# Patient Record
Sex: Female | Born: 1995 | Race: Black or African American | Hispanic: No | Marital: Single | Smoking: Never smoker
Health system: Southern US, Community
[De-identification: ages and names within clinical notes are randomized; demographics above are authoritative.]

## PROBLEM LIST (undated history)

## (undated) ENCOUNTER — Inpatient Hospital Stay (HOSPITAL_COMMUNITY): Payer: 59

## (undated) DIAGNOSIS — Z803 Family history of malignant neoplasm of breast: Secondary | ICD-10-CM

## (undated) DIAGNOSIS — T7840XA Allergy, unspecified, initial encounter: Secondary | ICD-10-CM

## (undated) DIAGNOSIS — Z8481 Family history of carrier of genetic disease: Secondary | ICD-10-CM

## (undated) DIAGNOSIS — J45909 Unspecified asthma, uncomplicated: Secondary | ICD-10-CM

## (undated) DIAGNOSIS — Z8041 Family history of malignant neoplasm of ovary: Secondary | ICD-10-CM

## (undated) HISTORY — DX: Family history of malignant neoplasm of breast: Z80.3

## (undated) HISTORY — DX: Unspecified asthma, uncomplicated: J45.909

## (undated) HISTORY — DX: Allergy, unspecified, initial encounter: T78.40XA

## (undated) HISTORY — PX: NO PAST SURGERIES: SHX2092

## (undated) HISTORY — DX: Family history of carrier of genetic disease: Z84.81

## (undated) HISTORY — DX: Family history of malignant neoplasm of ovary: Z80.41

---
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
## 2017-01-01 NOTE — Telephone Encounter (Signed)
Scheduled appt.

## 2017-01-03 ENCOUNTER — Telehealth: Payer: Self-pay | Admitting: Medical Oncology

## 2017-01-03 NOTE — Telephone Encounter (Signed)
I returned call from on call service. Pt needs to r/s her "appointment". I called back and left message that pt has appt for genetic clinic on 02/08/16

## 2017-01-04 ENCOUNTER — Telehealth: Payer: Self-pay | Admitting: Genetics

## 2017-01-04 NOTE — Telephone Encounter (Signed)
OK. Thank you

## 2017-01-04 NOTE — Telephone Encounter (Signed)
01/04/17 patient called back to reschedule Genetic counseling appointment to 02/06/17 @ 10:00 am with Darral DashLindsay Smith.

## 2017-01-26 ENCOUNTER — Ambulatory Visit: Payer: Self-pay | Admitting: Internal Medicine

## 2017-02-06 ENCOUNTER — Other Ambulatory Visit: Payer: 59

## 2017-02-06 ENCOUNTER — Encounter: Payer: Self-pay | Admitting: Genetics

## 2017-02-06 ENCOUNTER — Inpatient Hospital Stay: Payer: 59 | Attending: Genetic Counselor | Admitting: Genetics

## 2017-02-06 DIAGNOSIS — Z803 Family history of malignant neoplasm of breast: Secondary | ICD-10-CM

## 2017-02-06 DIAGNOSIS — Z8041 Family history of malignant neoplasm of ovary: Secondary | ICD-10-CM | POA: Diagnosis not present

## 2017-02-06 DIAGNOSIS — Z8481 Family history of carrier of genetic disease: Secondary | ICD-10-CM | POA: Diagnosis not present

## 2017-02-06 NOTE — Progress Notes (Signed)
REFERRING PROVIDER: Aloha Gell, MD [place], [place] [address]  PRIMARY PROVIDER:  Alba Cory, MD  PRIMARY REASON FOR VISIT:  1. Family history of BRCA1 gene positive   2. Family history of breast cancer   3. Family history of ovarian cancer      HISTORY OF PRESENT ILLNESS:   [name], a 22 y.o. female, was seen for a [place] cancer genetics consultation at the request of Dr. Pamala Hurry due to a family history of a BRCA1 mutation identified in her mother.   [name] presents to clinic today to discuss the possibility of a hereditary predisposition to cancer, genetic testing, and to further clarify her future cancer risks, as well as potential cancer risks for family members.   [name] is a 22 y.o. female with no personal history of cancer.    HORMONAL RISK FACTORS:  Menarche was at age 48.  First live birth at age N/A.  OCP use for approximately depo years.  Ovaries intact: yes.  Hysterectomy: no.  Menopausal status: premenopausal.  HRT use: 0 years. Colonoscopy: no; not examined. Mammogram within the last year: no. Number of breast biopsies: 0.  Past Medical History:  Diagnosis Date  . Allergy   . Asthma   . Family history of BRCA1 gene positive   . Family history of breast cancer   . Family history of ovarian cancer     No past surgical history on file.  Social History   Socioeconomic History  . Marital status: Single    Spouse name: None  . Number of children: None  . Years of education: None  . Highest education level: None  Social Needs  . Financial resource strain: None  . Food insecurity - worry: None  . Food insecurity - inability: None  . Transportation needs - medical: None  . Transportation needs - non-medical: None  Occupational History  . None  Tobacco Use  . Smoking status: Never Smoker  . Smokeless tobacco: Never Used  Substance and Sexual Activity  . Alcohol use: Yes  . Drug use: No  . Sexual activity: None   Other Topics Concern  . None  Social History Narrative  . None     FAMILY HISTORY:  We obtained a detailed, 4-generation family history.  Significant diagnoses are listed below: Family History  Problem Relation Age of Onset  . Breast cancer Mother 35       recurred in 44's  . Hypertension Maternal Grandmother   . Diabetes Maternal Grandmother   . Other Maternal Grandmother        hysterectomy in 40's  . Ovarian cancer Other   . Breast cancer Other    [name] no children, and has a 44 year-old sister with no history of cancer.  This sister has recently had a baby daughter.    [name] mother is not in her 32's, had breast cancer at 57 and again in her 68's.  She had genetic testing in Nov. 2012 for the BRCA1, BRCA2 and TP53 genes.  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 [name] has 5 maternal uncles with no history of cancer.  She has several maternal cousins, none with any history of cancer and none of her cousins has had genetic testing to her knowledge.  [name] maternal grandfather died in his 32's with no history of cancer.  He had 5 sisters- 1 who died in her 37's due to breast  cancer, 2 who had unknown types of cancer, and 2 with no history of cancer.  [name] maternal grandmother is in her 57's with no history of cancer.  She had a hysterectomy in her 8's.  This grandmother has 4 sisters- 19 of which died of cancer at 59 (unknown type).  [name] maternal grandmother's mother (great grand mother) died of ovarian cancer in her 29's.    [name] father is is about 45 and has several health issues (with his liver?), but no cancer.  [name] does not think her father had any siblings.  She knows her paternal grandparents are deceased at 16older ages20 and is not aware of any history of cancer. However, she does not know much about these relatives.    Patient's maternal ancestors are of African American descent, and paternal  ancestors are of African American descent. There is no reported Ashkenazi Jewish ancestry. There is no known consanguinity.  GENETIC COUNSELING ASSESSMENT: Cindy Bates is a 22 y.o. female with a family history of a BRCA1 mutation in her mother. We, therefore, discussed and recommended the following at today's visit.   DISCUSSION: We reviewed the characteristics, features and inheritance patterns of hereditary cancer syndromes. We also discussed genetic testing, including the appropriate family members to test, the process of testing, insurance coverage and turn-around-time for results. We discussed the implications of a negative, positive and/or variant of uncertain significant result. We recommended Ms. Linney pursue genetic testing for the Common Hereditary Cancer gene panel. The Common Hereditary Cancer Panel offered by [facility] includes sequencing and/or deletion duplication testing of the following 47 genes: APC, ATM, AXIN2, BARD1, BMPR1A, BRCA1, BRCA2, BRIP1, CDH1, CDKN2A (p14ARF), CDKN2A (p16INK4a), CKD4, CHEK2, CTNNA1, DICER1, EPCAM (Deletion/duplication testing only), GREM1 (promoter region deletion/duplication testing only), KIT, MEN1, MLH1, MSH2, MSH3, MSH6, MUTYH, NBN, NF1, NHTL1, PALB2, PDGFRA, PMS2, POLD1, POLE, PTEN, RAD50, RAD51C, RAD51D, SDHB, SDHC, SDHD, SMAD4, SMARCA4. STK11, TP53, TSC1, TSC2, and VHL.  The following genes were evaluated for sequence changes only: SDHA and HOXB13 c.251G>A variant only.  We discussed that her mother's BRCA1 mutation means her mother has Hereditary Breast and Ovarian Cancer (HBOC) syndrome.  This syndrome is caused by mutations in the BRCA1 and BRCA2 genes.  This syndrome increases an individual's lifetime risk to develop breast, ovarian, pancreatic, and other types of cancer.  There are also many other cancer predisposition syndromes caused by mutations in several other genes.  We discussed that Ms. Traister is at a 50% chance to have inherited this BRCA1 mutation  from her mother.    We discussed high risk breast screening, the option of prophylactic bilateral mastectomy, prophylactic BSO, and briefly discussed lifestyle modifications and chemoprevention.     We discussed that if she is found to have a mutation in BRCA1 (or any other genes on the panel), it may alter future medical management recommendations such as increased cancer screenings and consideration of risk reducing surgeries.  A positive result could also have implications for the patient's family members.  A Negative result would mean she did not inherit her mother's BRCA1 mutation and we did not identify any other mutation in other cancer predisposition genes.  This will provide a definitive answer regarding the familial BRCA1 mutation.  However, given we do not know everything about cancer genetics at this time, it would not completely rule out the possibility of a hereditary predisposition to cancer.  There could be mutations that are undetectable by current technology, or in genes not  yet tested or identified to increase cancer risk.    The lab will be able to provide a definitive answer regarding the known familial BRCA1 mutation.  However, we discussed the potential to find a Variant of Uncertain Significance or VUS in the other genes analyzed on the panel.  These are variants that have not yet been identified as pathogenic or benign, and it is unknown if this variant is associated with increased cancer risk or if this is a normal finding.  Most VUS's are reclassified to benign or likely benign. It should not be used to make medical management decisions. With time, we suspect the lab will determine the significance of any VUS's identified if any.   Based on Ms. Sarracino family history of cancer, she meets medical criteria for genetic testing. Despite that she meets criteria, she may still have an out of pocket cost. We discussed that if her out of pocket cost for testing is over $100, the laboratory  will call and confirm whether she wants to proceed with testing.  If the out of pocket cost of testing is less than $100 she will be billed by the genetic testing laboratory.   We discussed that some people do not want to undergo genetic testing due to fear of genetic discrimination.  A federal law called the Genetic Information Non-Discrimination Act (GINA) of 2008 helps protect individuals against genetic discrimination based on their genetic test results.  It impacts both health insurance and employment.  For health insurance, it protects against increased premiums, being kicked off insurance or being forced to take a test in order to be insured.  For employment it protects against hiring, firing and promoting decisions based on genetic test results.  Health status due to a cancer diagnosis is not protected under GINA.  This law does not protect life insurance, disability insurance, or other types of insurance.   Ms. Valdes says that thinking about possibly having a BRCA1 mutation is difficult for her, and she is still trying to take all this information in.  However, she has known that she would do genetic testing for a while and has discussed this with her family.  She does want to proceed with testing and learn her mutation status now and feels ready to proceed with testing.    PLAN: After considering the risks, benefits, and limitations, Ms. Mottley  provided informed consent to pursue genetic testing and the blood sample was sent to [facility] for analysis of the Common Hereditary Cancer Panel. Results should be available within approximately 2-3 weeks' time.  Ms. Snell has requested that she receive these results in person.  We scheduled a follow-up appointment for February 22 at 10:00 AM for her to come in person to receive the results of her genetic testing.   At that point the results will be disclosed as will any additional recommendations warranted by these results. Ms. Sarabia will receive a  summary of her genetic counseling visit and a copy of her results once available. This information will also be available in Epic. We encouraged Ms. Tomlinson to remain in contact with cancer genetics annually so that we can continuously update the family history and inform her of any changes in cancer genetics and testing that may be of benefit for her family. Ms. Sisler questions were answered to her satisfaction today. Our contact information was provided should additional questions or concerns arise.  Based on Ms. Hellickson family history, we recommended her sister and maternal relatives, have  genetic counseling and testing. Ms. Pikus will let us know if we can be of any assistance in coordinating genetic counseling and/or testing for this family member.   Ms. Currin was provided with information from the support groups FORCE, [facility] Peer Navigation Program, and [facility], and knows these are available to her.  Some people find these resources helpful.     Lastly, we encouraged Ms. Fern to remain in contact with cancer genetics annually so that we can continuously update the family history and inform her of any changes in cancer genetics and testing that may be of benefit for this family.   Ms.  Petersen questions were answered to her satisfaction today. Our contact information was provided should additional questions or concerns arise. Thank you for the referral and allowing [place] to share in the care of your patient.   Tana Felts, MS Genetic Counselor [email or web address] phone: [phone]  The patient was seen for a total of 60 minutes in face-to-face genetic counseling.  This patient was discussed with Drs. Magrinat, Lindi Adie and/or Burr Medico who agrees with the above.

## 2017-02-07 ENCOUNTER — Encounter: Payer: Self-pay | Admitting: Genetic Counselor

## 2017-02-07 ENCOUNTER — Other Ambulatory Visit: Payer: Self-pay

## 2017-02-08 NOTE — Progress Notes (Signed)
Subjective:    Patient ID: Cindy Bates, female    DOB: October 19, 1995, 22 y.o.   MRN: [id number]  HPI  She is here to establish with a new pcp.  She is here for a physical exam.   She thinks she has a ring worm on her scalp.  She saw a derm a ago while for this and was daignosed with ring worm.  She was prescribed an oral medication and it did help.  She thought it went away, but it did come back.  She denies any side effects with the medication.  She stood up and started walking 1 week ago and had a sharp pain in her right hip.  She had difficulty walking.  She used a heating pad and it helped a little she has taken aleve.  Now it only hurts when she walks.  She was concerned since being on the Depo-Provera that she may have developed something like osteoporosis.   Medications and allergies reviewed with patient and updated if appropriate.  Patient Active Problem List   Diagnosis Date Noted  . 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 Asthma 02/09/2017  . Hip pain, acute, right 02/09/2017  . Family history of BRCA1 gene positive   . Family history of breast cancer   . Family history of ovarian cancer     Current Outpatient Medications on File Prior to Visit  Medication Sig Dispense Refill  . medroxyPROGESTERone (DEPO-PROVERA) 150 MG/ML injection Inject 150 mg into the muscle every 3 (three) months.    Marland Kitchen acyclovir (ZOVIRAX) 400 MG tablet Take 400 mg by mouth 4 (four) times daily as needed (outbreaks).      No current facility-administered medications on file prior to visit.     Past Medical History:  Diagnosis Date  . Allergy   . Asthma   . Family history of BRCA1 gene positive   . Family history of breast cancer   . Family history of ovarian cancer     History reviewed. No pertinent surgical history.  Social History   Socioeconomic History  . Marital status: Single    Spouse name: None  . Number of children: None  . Years of education: None  . Highest education level: None    Social Needs  . Financial resource strain: None  . Food insecurity - worry: None  . Food insecurity - inability: None  . Transportation needs - medical: None  . Transportation needs - non-medical: None  Occupational History  . None  Tobacco Use  . Smoking status: Never Smoker  . Smokeless tobacco: Never Used  Substance and Sexual Activity  . Alcohol use: No    Frequency: Never  . Drug use: No  . Sexual activity: None  Other Topics Concern  . None  Social History Narrative  . None    Family History  Problem Relation Age of Onset  . Breast cancer Mother 47       recurred in 3's  . Hypertension Mother   . Alcohol abuse Father   . Hypertension Father   . Hypertension Maternal Grandmother   . Diabetes Maternal Grandmother   . Other Maternal Grandmother        hysterectomy in 40's  . Alcohol abuse Paternal Grandfather   . Ovarian cancer Other   . Breast cancer Other     Review of Systems  Constitutional: Negative for appetite change, chills, fatigue, fever and unexpected weight change.  Eyes: Negative for visual disturbance.  Respiratory: Positive  for shortness of breath (asthma). Negative for cough and wheezing.   Cardiovascular: Negative for chest pain, palpitations and leg swelling.  Gastrointestinal: Negative for abdominal pain, blood in stool, constipation, diarrhea and nausea.  Genitourinary: Negative for dysuria and hematuria.  Musculoskeletal: Negative for arthralgias.  Skin: Positive for rash (scalp - ringworm).  Neurological: Negative for light-headedness and headaches.  Psychiatric/Behavioral: Negative for dysphoric mood. The patient is nervous/anxious (manageable).        Objective:   Vitals:   02/09/17 0934  BP: 108/78  Pulse: 88  Resp: 16  Temp: 98.2 F (36.8 C)  SpO2: 99%   Filed Weights   02/09/17 0934  Weight: 127 lb (57.6 kg)   Body mass index is 22.5 kg/m.  Wt Readings from Last 3 Encounters:  02/09/17 127 lb (57.6 kg) (48 %, Z=  -0.05)*  11/11/15 112 lb (50.8 kg) (23 %, Z= -0.75)*  09/27/15 110 lb (49.9 kg) (19 %, Z= -0.87)*   * Growth percentiles are based on CDC (Girls, 2-20 Years) data.     Physical Exam Constitutional: She appears well-developed and well-nourished. No distress.  HENT:  Head: Normocephalic and atraumatic.  Right Ear: External ear normal. Normal ear canal and TM Left Ear: External ear normal.  Normal ear canal and TM Mouth/Throat: Oropharynx is clear and moist.  Eyes: Conjunctivae and EOM are normal.  Neck: Neck supple. No tracheal deviation present.  Right-sided thyromegaly present.  No carotid bruit  Cardiovascular: Normal rate, regular rhythm and normal heart sounds.   No murmur heard.  No edema. Pulmonary/Chest: Effort normal and breath sounds normal. No respiratory distress. She has no wheezes. She has no rales.  Breast: deferred to Gyn Abdominal: Soft. She exhibits no distension. There is no tenderness.  Lymphadenopathy: She has no cervical adenopathy.  Skin: Skin is warm and dry. She is not diaphoretic. Erythematous ring on top of head-likely ringworm since she did have a scraping of it previously Psychiatric: She has a normal mood and affect. Her behavior is normal.        Assessment & Plan:   Physical exam: Screening blood work  deferred-she does not like needles and would prefer to avoid blood work Immunizations   Up to date  Gyn  Up to date Exercise  Walking through campus Weight very active walking around campus, no regimented exercise Skin   scalp rash, no other lesions Substance abuse  none    See Problem List for Assessment and Plan of chronic medical problems.  Follow-up as needed

## 2017-02-09 ENCOUNTER — Ambulatory Visit (INDEPENDENT_AMBULATORY_CARE_PROVIDER_SITE_OTHER): Payer: 59 | Admitting: Internal Medicine

## 2017-02-09 ENCOUNTER — Encounter: Payer: Self-pay | Admitting: Internal Medicine

## 2017-02-09 VITALS — BP 108/78 | HR 88 | Temp 98.2°F | Resp 16 | Ht 63.0 in | Wt 127.0 lb

## 2017-02-09 DIAGNOSIS — Z0001 Encounter for general adult medical examination with abnormal findings: Secondary | ICD-10-CM

## 2017-02-09 DIAGNOSIS — B009 Herpesviral infection, unspecified: Secondary | ICD-10-CM

## 2017-02-09 DIAGNOSIS — M25551 Pain in right hip: Secondary | ICD-10-CM | POA: Diagnosis not present

## 2017-02-09 DIAGNOSIS — E010 Iodine-deficiency related diffuse (endemic) goiter: Secondary | ICD-10-CM | POA: Diagnosis not present

## 2017-02-09 DIAGNOSIS — J45909 Unspecified asthma, uncomplicated: Secondary | ICD-10-CM | POA: Insufficient documentation

## 2017-02-09 DIAGNOSIS — J4520 Mild intermittent asthma, uncomplicated: Secondary | ICD-10-CM | POA: Diagnosis not present

## 2017-02-09 DIAGNOSIS — B350 Tinea barbae and tinea capitis: Secondary | ICD-10-CM | POA: Diagnosis not present

## 2017-02-09 MED ORDER — TINIDAZOLE 500 MG PO TABS: ORAL_TABLET | ORAL | 1 refills | Status: DC

## 2017-02-09 NOTE — Assessment & Plan Note (Signed)
Right-sided thyromegaly on exam We will check ultrasound

## 2017-02-09 NOTE — Assessment & Plan Note (Signed)
Oral herpes Uses acyclovir as needed

## 2017-02-09 NOTE — Assessment & Plan Note (Signed)
Started 1 week ago It is improving Advised her that it is not osteoporosis and most likely will completely resolve Continue symptomatic treatment If pain continues she will let me know and I will refer her to

## 2017-02-09 NOTE — Assessment & Plan Note (Addendum)
Mild, intermittent Rarely uses inhaler

## 2017-02-09 NOTE — Assessment & Plan Note (Signed)
Rash on top of head Was diagnosed by dermatology after scraping with ringworm Treated with an antifungal-she did not have any side effects and it did help, but it recurred Will use the same treatment, but a slightly longer course-up to 2 months She will see dermatology if it recurs again

## 2017-02-09 NOTE — Patient Instructions (Signed)
  All other Health Maintenance issues reviewed.   All recommended immunizations and age-appropriate screenings are up-to-date or discussed.  No immunizations administered today.   Medications reviewed and updated.  No changes recommended at this time.   A thyroid ultrasound was ordered - someone will call you to schedule this.

## 2017-02-23 ENCOUNTER — Ambulatory Visit
Admission: RE | Admit: 2017-02-23 | Discharge: 2017-02-23 | Disposition: A | Discharge status: Home / Self Care | Payer: 59 | Source: Ambulatory Visit | Attending: Internal Medicine | Admitting: Internal Medicine

## 2017-02-23 DIAGNOSIS — E010 Iodine-deficiency related diffuse (endemic) goiter: Secondary | ICD-10-CM

## 2017-03-08 ENCOUNTER — Encounter: Payer: Self-pay | Admitting: Genetics

## 2017-03-09 ENCOUNTER — Inpatient Hospital Stay: Payer: 59 | Attending: Genetic Counselor | Admitting: Genetics

## 2017-03-09 DIAGNOSIS — Z8481 Family history of carrier of genetic disease: Secondary | ICD-10-CM

## 2017-03-09 DIAGNOSIS — Z1379 Encounter for other screening for genetic and chromosomal anomalies: Secondary | ICD-10-CM | POA: Diagnosis not present

## 2017-03-09 DIAGNOSIS — Z8041 Family history of malignant neoplasm of ovary: Secondary | ICD-10-CM | POA: Diagnosis not present

## 2017-03-09 DIAGNOSIS — Z803 Family history of malignant neoplasm of breast: Secondary | ICD-10-CM | POA: Diagnosis not present

## 2017-03-09 NOTE — Progress Notes (Signed)
HPI: Cindy Bates was previously seen in the [facility] on 02/07/2107 due to a family history of breast/ovariant cancer and a known BRCA1 mutation identified in her mother.  Please refer to our prior cancer genetics clinic note for more information regarding Cindy Bates medical, social and family histories, and our assessment and recommendations, at the time. Cindy Bates recent genetic test results were disclosed to her, as well as recommendations warranted by these results. These results and recommendations are discussed in more detail below.  CANCER HISTORY:   No history exists.     FAMILY HISTORY:  We obtained a detailed, 4-generation family history.  Significant diagnoses are listed below: Family History  Problem Relation Age of Onset  . Breast cancer Mother 86       recurred in 9's  . Hypertension Mother   . Alcohol abuse Father   . Hypertension Father   . Hypertension Maternal Grandmother   . Diabetes Maternal Grandmother   . Other Maternal Grandmother        hysterectomy in 40's  . Alcohol abuse Paternal Grandfather   . Ovarian cancer Other   . Breast cancer Other    Cindy Bates no children, and has a 57 year-old sister with no history of cancer.  This sister has recently had a baby daughter.    Cindy Bates mother is not in her 39's, had breast cancer at 84 and again in her 74's.  She had genetic testing in Nov. 2012 for the BRCA1, BRCA2 and TP53 genes.  This genetic testing was positive for a pathogenic variant in BRCA1 [id number] ([id number]>A). Cindy Bates has 5 maternal uncles with no history of cancer.  She has several maternal cousins, none with any history of cancer and none of her cousins has had genetic testing to her knowledge.  Cindy Bates maternal grandfather died in his 48's with no history of cancer.  He had 5 sisters- 1 who died in her 69's due to breast cancer, 2 who had unknown types of cancer, and 2 with no history of cancer.  Cindy Bates maternal grandmother is in  her 33's with no history of cancer.  She had a hysterectomy in her 76's.  This grandmother has 4 sisters- 26 of which died of cancer at 48 (unknown type).  Cindy Bates maternal grandmother's mother (great grand mother) died of ovarian cancer in her 67's.    Cindy Bates father is is about 53 and has several health issues (with his liver?), but no cancer.  Cindy Bates does not think her father had any siblings.  She knows her paternal grandparents are deceased at 60older ages69 and is not aware of any history of cancer. However, she does not know much about these relatives.    Patient's maternal ancestors are of African American descent, and paternal ancestors are of African American descent. There is no reported Ashkenazi Jewish ancestry. There is no known consanguinity.  GENETIC TEST RESULTS: Genetic testing performed through [facility]'s Common Hereditary Cancer Panel reported out on 02/21/2017 showed no pathogenic mutations. The Common Hereditary Cancer Panel offered by [facility] includes sequencing and/or deletion duplication testing of the following 47 genes: APC, ATM, AXIN2, BARD1, BMPR1A, BRCA1, BRCA2, BRIP1, CDH1, CDKN2A (p14ARF), CDKN2A (p16INK4a), CKD4, CHEK2, CTNNA1, DICER1, EPCAM (Deletion/duplication testing only), GREM1 (promoter region deletion/duplication testing only), KIT, MEN1, MLH1, MSH2, MSH3, MSH6, MUTYH, NBN, NF1, NHTL1, PALB2, PDGFRA, PMS2, POLD1, POLE, PTEN, RAD50, RAD51C, RAD51D, SDHB, SDHC, SDHD, SMAD4, SMARCA4. STK11, TP53, TSC1, TSC2, and VHL.  The following genes were evaluated for sequence changes only: SDHA and HOXB13 c.251G>A variant only..  The test report will be scanned into EPIC and will be located under the Molecular Pathology section of the Results Review tab.A portion of the result report is included below for reference.     We discussed with Cindy Bates that because current genetic testing is not perfect, it is possible there may be a gene mutation in one of these genes that  current testing cannot detect, but that chance is small. We also discussed, that there could be another gene that has not yet been discovered, or that we have not yet tested, that is responsible for the cancer diagnoses in the family. It is also possible there is a hereditary cause for the cancer in the family that Cindy Bates did not inherit and therefore was not identified in her testing.  Therefore, it is important to remain in touch with cancer genetics in the future so that we can continue to offer Cindy Bates the most up to date genetic testing.   Cindy Bates test was normal and did not reveal the familial BRCA1 mutation. We call this result a true negative result because the cancer-causing mutation was identified in Cindy Bates family, and she did not inherit it.  Given this negative result, Cindy Bates chances of developing BRCA-related cancers are likely the same as they are in the general population.  We discussed that Cindy Bates should continue to follow the cancer screening recommendations provided to her by her medical providers as other factors may still impact her risk, and there are general population screening guidelines that are still important to follow.   ADDITIONAL GENETIC TESTING: We discussed with Cindy Bates that there are other genes that are associated with increased cancer risk that can be analyzed. The laboratories that offer this testing look at these additional genes via a hereditary cancer gene panel. Should Ms. Shimizu wish to pursue additional genetic testing, we are happy to discuss and coordinate this testing, at any time.    CANCER SCREENING RECOMMENDATIONS:  Genetic testing has provided a diagnostic/definitive answer regarding the absence of the familial BRCA1 pathogenic variant in Ms. Mullings.   Genetic testing for the other genes on this panel was also negative.  This indicates no other pathogenic variants were identified in any of the other cancer risk genes analyzed.  However, genetic  testing is not perfect and cannot definitively rule out a hereditary predisposition to cancer as there could be mutations in genes not yet identified to increase cancer risk/tested or our technology does not yet detect all genetic changes.   RECOMMENDATIONS FOR FAMILY MEMBERS: Women in this family might be at some increased risk of developing cancer, over the general population risk, simply due to the family history of cancer. We recommended women in this family have a yearly mammogram beginning at age 6, or 20 years younger than the earliest onset of cancer, an annual clinical breast exam, and perform monthly breast self-exams. Women in this family should also have a gynecological exam as recommended by their primary provider. All family members should have a colonoscopy by age 32.  All family members should inform their physicians about the family history of cancer so their doctors can make the most appropriate screening recommendations for them.   Based on Ms. Rask family history of a BRCA1, we strongly recommended her sister and all other maternal relatives, have genetic counseling and testing. Ms. Gullickson will let us know  if we can be of any assistance in coordinating genetic counseling and/or testing for these family members.   FOLLOW-UP: Lastly, we discussed with Ms. Hadaway that cancer genetics is a rapidly advancing field and it is possible that new genetic tests will be appropriate for her and/or her family members in the future. We encouraged her to remain in contact with cancer genetics on an annual basis so we can update her personal and family histories and let her know of advances in cancer genetics that may benefit this family.   Our contact number was provided. Ms. Balla questions were answered to her satisfaction, and she knows she is welcome to call us at anytime with additional questions or concerns.   Ferol Luz, MS Genetic Counselor [email or web address]  The patient was  seen for a total of 25 minutes in face-to-face genetic counseling. Her mother, Langley Gauss, was also present at the appointment.

## 2017-04-07 NOTE — Progress Notes (Signed)
Subjective:    Patient ID: Cindy Bates, female    DOB: 06-Apr-1995, 22 y.o.   MRN: [id number]  HPI The patient is here for an acute visit.   Hair loss:  She noticed hair loss one week ago.  She notice it in the center of her upper forehead.  She sees a dermatologist for the hair loss on the vertex of her head and she was prescribed betamethasone.  She applies it once a day and itis not working.  She has a follow up with dermatology on 4/8.  She denies other concerning symptoms.  Her mom has alopecia areata.   Medications and allergies reviewed with patient and updated if appropriate.  Patient Active Problem List   Diagnosis Date Noted  . Genetic testing 03/09/2017  . 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 Asthma 02/09/2017  . Hip pain, acute, right 02/09/2017  . Ringworm of the scalp 02/09/2017  . 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 Family history of BRCA1 gene positive   . Family history of breast cancer   . Family history of ovarian cancer     Current Outpatient Medications on File Prior to Visit  Medication Sig Dispense Refill  . acyclovir (ZOVIRAX) 400 MG tablet Take 400 mg by mouth 4 (four) times daily as needed (outbreaks).     . medroxyPROGESTERone (DEPO-PROVERA) 150 MG/ML injection Inject 150 mg into the muscle every 3 (three) months.    . tinidazole (TINDAMAX) 500 MG tablet 2 (TWO) TABLET(S) DAILY 60 tablet 1  . betamethasone valerate (VALISONE) 0.1 % cream 1 APPLICATION APPLY ON THE SKIN DAILY AS NEEDED  1   No current facility-administered medications on file prior to visit.     Past Medical History:  Diagnosis Date  . Allergy   . Asthma   . Family history of BRCA1 gene positive   . Family history of breast cancer   . Family history of ovarian cancer     No past surgical history on file.  Social History   Socioeconomic History  . Marital status: Single    Spouse name: Not on file  . Number of children: Not on file  . Years of education: Not on file  . Highest education  level: Not on file  Occupational History  . Not on file  Social Needs  . Financial resource strain: Not on file  . Food insecurity:    Worry: Not on file    Inability: Not on file  . Transportation needs:    Medical: Not on file    Non-medical: Not on file  Tobacco Use  . Smoking status: Never Smoker  . Smokeless tobacco: Never Used  Substance and Sexual Activity  . Alcohol use: No    Frequency: Never  . Drug use: No  . Sexual activity: Not on file  Lifestyle  . Physical activity:    Days per week: Not on file    Minutes per session: Not on file  . Stress: Not on file  Relationships  . Social connections:    Talks on phone: Not on file    Gets together: Not on file    Attends religious service: Not on file    Active member of club or organization: Not on file    Attends meetings of clubs or organizations: Not on file    Relationship status: Not on file  Other Topics Concern  . Not on file  Social History Narrative  . Not on file    Family History  Problem Relation Age of Onset  . Breast cancer Mother 59       recurred in 64's  . Hypertension Mother   . Alcohol abuse Father   . Hypertension Father   . Hypertension Maternal Grandmother   . Diabetes Maternal Grandmother   . Other Maternal Grandmother        hysterectomy in 40's  . Alcohol abuse Paternal Grandfather   . Ovarian cancer Other   . Breast cancer Other     Review of Systems  Constitutional: Negative for chills, diaphoresis, fever and unexpected weight change.  Cardiovascular: Negative for chest pain and palpitations.  Gastrointestinal: Negative for constipation and diarrhea.  Skin: Negative for color change and rash.       Objective:   Vitals:   04/09/17 0748  BP: 120/84  Pulse: 91  Resp: 16  Temp: 98.2 F (36.8 C)   Wt Readings from Last 3 Encounters:  04/09/17 123 lb (55.8 kg) (40 %, Z= -0.26)*  02/09/17 127 lb (57.6 kg) (48 %, Z= -0.05)*  11/11/15 112 lb (50.8 kg) (23 %, Z= -0.75)*    * Growth percentiles are based on CDC (Girls, 2-20 Years) data.   Body mass index is 21.79 kg/m.   Physical Exam  Constitutional: She appears well-developed and well-nourished. No distress.  HENT:  Head: Normocephalic and atraumatic.  Skin: Skin is warm and dry. No rash noted. She is not diaphoretic. No erythema.  Minimal hair thinning posterior top of head, moderate hair thinning central frontal region size of a silver dollar w/o erythema/rash, scarring           Assessment & Plan:    See Problem List for Assessment and Plan of chronic medical problems.

## 2017-04-09 ENCOUNTER — Encounter: Payer: Self-pay | Admitting: Internal Medicine

## 2017-04-09 ENCOUNTER — Ambulatory Visit (INDEPENDENT_AMBULATORY_CARE_PROVIDER_SITE_OTHER): Payer: 59 | Admitting: Internal Medicine

## 2017-04-09 VITALS — BP 120/84 | HR 91 | Temp 98.2°F | Resp 16 | Ht 63.0 in | Wt 123.0 lb

## 2017-04-09 DIAGNOSIS — L639 Alopecia areata, unspecified: Secondary | ICD-10-CM | POA: Diagnosis not present

## 2017-04-09 NOTE — Assessment & Plan Note (Signed)
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

## 2017-04-09 NOTE — Patient Instructions (Signed)
Start using the steroid cream twice daily.     Follow up with dermatology.     Alopecia Areata, Adult Alopecia areata is a condition that causes you to lose hair. You may lose hair on your scalp in patches. In some cases, you may lose all the hair on your scalp (alopecia totalis) or all the hair from your face and body (alopecia universalis). Alopecia areata is an autoimmune disease. This means that your body's defense system (immune system) mistakes normal parts of the body for germs or other things that can make you sick. When you have alopecia areata, the immune system attacks the hair follicles. Alopecia areata usually develops in childhood, but it can develop at any age. For some people, their hair grows back on its own and hair loss does not happen again. For others, their hair may fall out and grow back in cycles. The hair loss may last many years. Having this condition can be emotionally difficult, but it is not dangerous. What are the causes? The cause of this condition is not known. What increases the risk? This condition is more likely to develop in people who have:  A family history of alopecia.  A family history of another autoimmune disease, including type 1 diabetes and rheumatoid arthritis.  Asthma and allergies.  Down syndrome.  What are the signs or symptoms? Round spots of patchy hair loss on the scalp is the main symptom of this condition. The spots may be mildly itchy. Other symptoms include:  Short dark hairs in the bald patches that are wider at the top (exclamation point hairs).  Dents, white spots, or lines in the fingernails or toenails.  Balding and body hair loss. This is rare.  How is this diagnosed? This condition is diagnosed based on your symptoms and family history. Your health care provider will also check your scalp skin, teeth, and nails. Your health care provider may refer you to a specialist in hair and skin disorders (dermatologist). You may  also have tests, including:  A hair pull test.  Blood tests or other screening tests to check for autoimmune diseases, such as thyroid disease or diabetes.  Skin biopsy to confirm the diagnosis.  A procedure to examine the skin with a lighted magnifying instrument (dermoscopy).  How is this treated? There is no cure for alopecia areata. Treatment is aimed at promoting the regrowth of hair and preventing the immune system from overreacting. No single treatment is right for all people with alopecia areata. It depends on the type of hair loss you have and how severe it is. Work with your health care provider to find the best treatment for you. Treatment may include:  Having regular checkups to make sure the condition is not getting worse (watchful waiting).  Steroid creams or pills for 6-8 weeks to stop the immune reaction and help hair to regrow more quickly.  Other topical medicines to alter the immune system response and support the hair growth cycle.  Steroid injections.  Therapy and counseling with a support group or therapist if you are having trouble coping with hair loss.  Follow these instructions at home:  Learn as much as you can about your condition.  Apply topical creams only as told by your health care provider.  Take over-the-counter and prescription medicines only as told by your health care provider.  Consider getting a wig or products to make hair look fuller or to cover bald spots, if you feel uncomfortable with your appearance.  Get therapy or counseling if you are having a hard time coping with hair loss. Ask your health care provider to recommend a counselor or support group.  Keep all follow-up visits as told by your health care provider. This is important. Contact a health care provider if:  Your hair loss gets worse, even with treatment.  You have new symptoms.  You are struggling emotionally. Summary  Alopecia areata is an autoimmune condition that  makes your body's defense system (immune system) attack the hair follicles. This causes you to lose hair.  Treatments may include regular checkups to make sure that the condition is not getting worse (watchful waiting), medicines, and steroid injections. This information is not intended to replace advice given to you by your health care provider. Make sure you discuss any questions you have with your health care provider. Document Released: 08/07/2003 Document Revised: 01/21/2016 Document Reviewed: 01/21/2016 Elsevier Interactive Patient Education  2018 [employer]

## 2017-04-18 ENCOUNTER — Ambulatory Visit (INDEPENDENT_AMBULATORY_CARE_PROVIDER_SITE_OTHER): Payer: 59 | Admitting: Family

## 2017-04-18 ENCOUNTER — Ambulatory Visit: Payer: Self-pay

## 2017-04-18 VITALS — BP 106/80 | HR 104 | Temp 98.6°F | Resp 16 | Ht 63.0 in | Wt 125.0 lb

## 2017-04-18 DIAGNOSIS — J209 Acute bronchitis, unspecified: Secondary | ICD-10-CM | POA: Diagnosis not present

## 2017-04-18 DIAGNOSIS — J45909 Unspecified asthma, uncomplicated: Secondary | ICD-10-CM | POA: Diagnosis not present

## 2017-04-18 MED ORDER — CEFDINIR 300 MG PO CAPS: 300.0000 mg | ORAL_CAPSULE | Freq: Two times a day (BID) | ORAL | 0 refills | Status: DC

## 2017-04-18 MED ORDER — LORATADINE-PSEUDOEPHEDRINE ER 10-240 MG PO TB24: 1.0000 | ORAL_TABLET | Freq: Every day | ORAL | 2 refills | Status: DC

## 2017-04-18 MED ORDER — PREDNISONE 20 MG PO TABS: 20.0000 mg | ORAL_TABLET | Freq: Every day | ORAL | 0 refills | Status: DC

## 2017-04-18 MED ORDER — ALBUTEROL SULFATE HFA 108 (90 BASE) MCG/ACT IN AERS: 2.0000 | INHALATION_SPRAY | Freq: Four times a day (QID) | RESPIRATORY_TRACT | 1 refills | Status: DC | PRN

## 2017-04-18 MED ORDER — METHYLPREDNISOLONE ACETATE 40 MG/ML IJ SUSP
40.0000 mg | Freq: Once | INTRAMUSCULAR | Status: AC
  Administered 2017-04-18: 40 mg via INTRAMUSCULAR

## 2017-04-18 NOTE — Progress Notes (Signed)
Cindy Bates is a 22 y.o. female with the following history as recorded in [facility]:  Patient Active Problem List   Diagnosis Date Noted  . 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 Genetic testing 03/09/2017  . 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 Asthma 02/09/2017  . Hip pain, acute, right 02/09/2017  . Ringworm of the scalp 02/09/2017  . 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 Family history of BRCA1 gene positive   . Family history of breast cancer   . Family history of ovarian cancer     Current Outpatient Medications  Medication Sig Dispense Refill  . acyclovir (ZOVIRAX) 400 MG tablet Take 400 mg by mouth 4 (four) times daily as needed (outbreaks).     . betamethasone valerate (VALISONE) 0.1 % cream 1 APPLICATION APPLY ON THE SKIN DAILY AS NEEDED  1  . medroxyPROGESTERone (DEPO-PROVERA) 150 MG/ML injection Inject 150 mg into the muscle every 3 (three) months.    Marland Kitchen albuterol (PROVENTIL HFA;VENTOLIN HFA) 108 (90 Base) MCG/ACT inhaler Inhale 2 puffs into the lungs every 6 (six) hours as needed for wheezing or shortness of breath. 1 Inhaler 1  . cefdinir (OMNICEF) 300 MG capsule Take 1 capsule (300 mg total) by mouth 2 (two) times daily. 20 capsule 0  . loratadine-pseudoephedrine (CLARITIN-D 24 HOUR) 10-240 MG 24 hr tablet Take 1 tablet by mouth daily. 60 tablet 2  . predniSONE (DELTASONE) 20 MG tablet Take 1 tablet (20 mg total) by mouth daily with breakfast. 5 tablet 0   No current facility-administered medications for this visit.     Allergies: Apple cider vinegar and Pear  Past Medical History:  Diagnosis Date  . Allergy   . Asthma   . Family history of BRCA1 gene positive   . Family history of breast cancer   . Family history of ovarian cancer     No past surgical history on file.  Family History  Problem Relation Age of Onset  . Breast cancer Mother 35       recurred in 14's  . Hypertension Mother   . Alcohol abuse Father   . Hypertension Father   . Hypertension Maternal Grandmother   . Diabetes  Maternal Grandmother   . Other Maternal Grandmother        hysterectomy in 40's  . Alcohol abuse Paternal Grandfather   . Ovarian cancer Other   . Breast cancer Other     Social History   Tobacco Use  . Smoking status: Never Smoker  . Smokeless tobacco: Never Used  Substance Use Topics  . Alcohol use: No    Frequency: Never    Subjective:  2 day history of feeling short of breath; history of asthma; does not have a current inhaler; symptoms worse at night; + wheezing; was able to get in steam shower last night with some benefit; has also taken Sudafed for sinus relief; notes that her asthma is worse during spring allergy season; does not think she has ever had to take a daily preventive inhaler for her asthma;    Objective:  Vitals:   04/18/17 1620  BP: 106/80  Pulse: (!) 104  Resp: 16  Temp: 98.6 F (37 C)  TempSrc: Oral  Weight: 125 lb (56.7 kg)  Height: '5\' 3"'  (1.6 m)    General: Well developed, well nourished, in no acute distress  Skin : Warm and dry.  Head: Normocephalic and atraumatic  Eyes: Sclera and conjunctiva clear; pupils round and reactive to light; extraocular movements intact  Ears: External normal;  canals clear; tympanic membranes normal  Oropharynx: Pink, supple. No suspicious lesions  Neck: Supple without thyromegaly, adenopathy  Lungs: Respirations unlabored; clear to auscultation bilaterally without wheeze, rales, rhonchi  CVS exam: normal rate and regular rhythm.  Neurologic: Alert and oriented; speech intact; face symmetrical; moves all extremities well; CNII-XII intact without focal deficit   Assessment:  1. Asthma, unspecified asthma severity, unspecified whether complicated, unspecified whether persistent   2. Acute bronchitis, unspecified organism     Plan: Suspect allergic asthma; Depo-Medrol IM 40 given in office today; she will start 20 mg of prednisone daily x 5 days; Rx for Omnicef 300 mg bid x 10 days; re-start her Claritin D for  allergy symptoms; refill given on albuterol to use as needed; she understands to follow-up if she is continuing to need albuterol inhaler everyday; increase fluids, rest and follow-up if symptoms are worse, no better.     No follow-ups on file.  No orders of the defined types were placed in this encounter.   Requested Prescriptions   Signed Prescriptions Disp Refills  . cefdinir (OMNICEF) 300 MG capsule 20 capsule 0    Sig: Take 1 capsule (300 mg total) by mouth 2 (two) times daily.  . predniSONE (DELTASONE) 20 MG tablet 5 tablet 0    Sig: Take 1 tablet (20 mg total) by mouth daily with breakfast.  . albuterol (PROVENTIL HFA;VENTOLIN HFA) 108 (90 Base) MCG/ACT inhaler 1 Inhaler 1    Sig: Inhale 2 puffs into the lungs every 6 (six) hours as needed for wheezing or shortness of breath.  . loratadine-pseudoephedrine (CLARITIN-D 24 HOUR) 10-240 MG 24 hr tablet 60 tablet 2    Sig: Take 1 tablet by mouth daily.

## 2017-04-18 NOTE — Telephone Encounter (Signed)
Pt. States she is having a "flare up of asthma". States has been out of her inhaler "a long time." States started Monday with increasing shortness of breath and wheezing. States last night she could not lay down to sleep. Non-productive cough. Appointment scheduled for today.  Reason for Disposition . [1] MILD asthma attack (e.g., no SOB at rest, mild SOB with walking, speaks normally in sentences, mild wheezing) AND [2]  persists > 24 hours on appropriate treatment  Answer Assessment - Initial Assessment Questions 1. RESPIRATORY STATUS: "Describe your breathing?" (e.g., wheezing, shortness of breath, unable to speak, severe coughing)      Shortness of breath and wheezing 2. ONSET: "When did this asthma attack begin?"      Started Monday 3. TRIGGER: "What do you think triggered this attack?" (e.g., URI, exposure to pollen or other allergen, tobacco smoke)      Unsure 4. PEAK EXPIRATORY FLOW RATE (PEFR): "Do you use a peak flow meter?" If so, ask: "What's the current peak flow? What's your personal best peak flow?"      No 5. SEVERITY: "How bad is this attack?"    - MILD: No SOB at rest, mild SOB with walking, speaks normally in sentences, can lay down, no retractions, pulse < 100. (GREEN Zone: PEFR 80-100%)   - MODERATE: SOB at rest, SOB with minimal exertion and prefers to sit, cannot lie down flat, speaks in phrases, mild retractions, audible wheezing, pulse 100-120. (YELLOW Zone: PEFR 50-80%)    - SEVERE: Very SOB at rest, speaks in single words, struggling to breathe, sitting hunched forward, retractions, usually loud wheezing, sometimes minimal wheezing because of decreased air movement, pulse > 120. (RED Zone: PEFR < 50%).      Worse at night - could not lay down flat 6. MEDICATIONS (Inhaler or nebs): "What are your asthma medications?" and "What treatments have you given so far?"    - Quick-relief: albuterol, metaproterenol, salbutamol, or other inhaled or nebulized beta-agonist  medicines   - Long-term-control: steroids, cromolyn, or other anti-inflammatory medicines.     Out of her inhaler 7. OTHER SYMPTOMS: "Do you have any other symptoms? (e.g., runny nose, chest pain, fever)     Cough 8. PREGNANCY: "Is there any chance you are pregnant?" "When was your last menstrual period?"     No  Protocols used: ASTHMA ATTACK-A-AH

## 2017-04-18 NOTE — Patient Instructions (Signed)

## 2017-04-18 NOTE — Progress Notes (Signed)
depo 

## 2017-04-19 ENCOUNTER — Encounter: Payer: Self-pay | Admitting: Family

## 2017-06-20 NOTE — Progress Notes (Signed)
  Subjective:    Patient ID: Cindy Bates, female    DOB: 10/14/1995, 22 y.o.   MRN: [id number]  HPI The patient is here for an acute visit.  Fatigue:  She has always felt fatigued, but in the past month it has gotten worse.  She is taking more naps and they are longer than usual.  She gets good sleep at night.  She feels she has good sleep quality.  She feels refreshed after the nap.  She eats well.    She has intermittent lightheadedness/dizziness.  It occurs randomly.  She denies any other new or concerning symptoms.     She is in college and working.  She walks a lot but does not exercise regularly.    Medications and allergies reviewed with patient and updated if appropriate.  Patient Active Problem List   Diagnosis Date Noted  . 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 Genetic testing 03/09/2017  . 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 Asthma 02/09/2017  . Hip pain, acute, right 02/09/2017  . Ringworm of the scalp 02/09/2017  . 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 Family history of BRCA1 gene positive   . Family history of breast cancer   . Family history of ovarian cancer     Current Outpatient Medications on File Prior to Visit  Medication Sig Dispense Refill  . acyclovir (ZOVIRAX) 400 MG tablet Take 400 mg by mouth 4 (four) times daily as needed (outbreaks).     . albuterol (PROVENTIL HFA;VENTOLIN HFA) 108 (90 Base) MCG/ACT inhaler Inhale 2 puffs into the lungs every 6 (six) hours as needed for wheezing or shortness of breath. 1 Inhaler 1  . betamethasone valerate (VALISONE) 0.1 % cream 1 APPLICATION APPLY ON THE SKIN DAILY AS NEEDED  1  . loratadine-pseudoephedrine (CLARITIN-D 24 HOUR) 10-240 MG 24 hr tablet Take 1 tablet by mouth daily. 60 tablet 2  . medroxyPROGESTERone (DEPO-PROVERA) 150 MG/ML injection Inject 150 mg into the muscle every 3 (three) months.     No current facility-administered medications on file prior to visit.     Past Medical History:  Diagnosis Date  . Allergy   . Asthma    . Family history of BRCA1 gene positive   . Family history of breast cancer   . Family history of ovarian cancer     No past surgical history on file.  Social History   Socioeconomic History  . Marital status: Single    Spouse name: Not on file  . Number of children: Not on file  . Years of education: Not on file  . Highest education level: Not on file  Occupational History  . Not on file  Social Needs  . Financial resource strain: Not on file  . Food insecurity:    Worry: Not on file    Inability: Not on file  . Transportation needs:    Medical: Not on file    Non-medical: Not on file  Tobacco Use  . Smoking status: Never Smoker  . Smokeless tobacco: Never Used  Substance and Sexual Activity  . Alcohol use: No    Frequency: Never  . Drug use: No  . Sexual activity: Not on file  Lifestyle  . Physical activity:    Days per week: Not on file    Minutes per session: Not on file  . Stress: Not on file  Relationships  . Social connections:    Talks on phone: Not on file    Gets together: Not on file      Attends religious service: Not on file    Active member of club or organization: Not on file    Attends meetings of clubs or organizations: Not on file    Relationship status: Not on file  Other Topics Concern  . Not on file  Social History Narrative  . Not on file    Family History  Problem Relation Age of Onset  . Breast cancer Mother 83       recurred in 20's  . Hypertension Mother   . Alcohol abuse Father   . Hypertension Father   . Hypertension Maternal Grandmother   . Diabetes Maternal Grandmother   . Other Maternal Grandmother        hysterectomy in 40's  . Alcohol abuse Paternal Grandfather   . Ovarian cancer Other   . Breast cancer Other     Review of Systems  Constitutional: Positive for fatigue. Negative for appetite change, chills, diaphoresis, fever and unexpected weight change.  Eyes: Negative for visual disturbance.  Respiratory:  Negative for cough, shortness of breath and wheezing.   Cardiovascular: Negative for chest pain, palpitations and leg swelling.  Gastrointestinal: Negative for abdominal pain, blood in stool, constipation, diarrhea and nausea.       No gerd  Genitourinary: Negative for dysuria and hematuria.  Musculoskeletal: Negative for arthralgias, back pain and myalgias.  Skin: Negative for color change and rash.  Neurological: Positive for dizziness (intermittently), light-headedness (intermittently) and headaches (sometimes in morning, maybe once a week).  Psychiatric/Behavioral: Negative for dysphoric mood and sleep disturbance. The patient is not nervous/anxious.        Objective:   Vitals:   06/21/17 0828  BP: 134/88  Pulse: (!) 124  Resp: 16  Temp: 98.6 F (37 C)  SpO2: 99%   BP Readings from Last 3 Encounters:  06/21/17 134/88  04/18/17 106/80  04/09/17 120/84   Wt Readings from Last 3 Encounters:  06/21/17 124 lb (56.2 kg)  04/18/17 125 lb (56.7 kg) (44 %, Z= -0.16)*  04/09/17 123 lb (55.8 kg) (40 %, Z= -0.26)*   * Growth percentiles are based on CDC (Girls, 2-20 Years) data.   Body mass index is 21.97 kg/m.   Physical Exam  Constitutional: She is oriented to person, place, and time. She appears well-developed and well-nourished. No distress.  HENT:  Head: Normocephalic and atraumatic.  Right Ear: External ear normal.  Left Ear: External ear normal.  Mouth/Throat: Oropharynx is clear and moist.  Normal bilateral ear canals and tympanic membranes  Eyes: Conjunctivae are normal.  Neck: Neck supple. No tracheal deviation present. Thyromegaly (Mild right-sided thyromegaly, ultrasound of the last year was normal) present.  Cardiovascular: Normal rate, regular rhythm and normal heart sounds.  No murmur heard. Pulmonary/Chest: Effort normal and breath sounds normal. No respiratory distress. She has no wheezes. She has no rales.  Abdominal: Soft. She exhibits no distension and  no mass. There is no tenderness.  Musculoskeletal: She exhibits no edema.  Lymphadenopathy:    She has no cervical adenopathy.  Neurological: She is alert and oriented to person, place, and time.  Skin: Skin is warm and dry. No rash noted. She is not diaphoretic.  Psychiatric: She has a normal mood and affect. Her behavior is normal.           Assessment & Plan:    See Problem List for Assessment and Plan of chronic medical problems.

## 2017-06-21 ENCOUNTER — Ambulatory Visit (INDEPENDENT_AMBULATORY_CARE_PROVIDER_SITE_OTHER): Payer: 59 | Admitting: Internal Medicine

## 2017-06-21 ENCOUNTER — Encounter: Payer: Self-pay | Admitting: Internal Medicine

## 2017-06-21 ENCOUNTER — Other Ambulatory Visit (INDEPENDENT_AMBULATORY_CARE_PROVIDER_SITE_OTHER): Payer: 59

## 2017-06-21 VITALS — BP 134/88 | HR 124 | Temp 98.6°F | Resp 16 | Wt 124.0 lb

## 2017-06-21 DIAGNOSIS — R5383 Other fatigue: Secondary | ICD-10-CM | POA: Diagnosis not present

## 2017-06-21 DIAGNOSIS — R42 Dizziness and giddiness: Secondary | ICD-10-CM | POA: Insufficient documentation

## 2017-06-21 LAB — CBC WITH DIFFERENTIAL/PLATELET
BASOS PCT: 0.7 % (ref 0.0–3.0)
Basophils Absolute: 0.1 10*3/uL (ref 0.0–0.1)
EOS PCT: 7.8 % — AB (ref 0.0–5.0)
Eosinophils Absolute: 0.6 10*3/uL (ref 0.0–0.7)
HEMATOCRIT: 39.2 % (ref 36.0–46.0)
HEMOGLOBIN: 13.2 g/dL (ref 12.0–15.0)
LYMPHS PCT: 45.2 % (ref 12.0–46.0)
Lymphs Abs: 3.4 10*3/uL (ref 0.7–4.0)
MCHC: 33.7 g/dL (ref 30.0–36.0)
MCV: 86.7 fl (ref 78.0–100.0)
MONOS PCT: 8.4 % (ref 3.0–12.0)
Monocytes Absolute: 0.6 10*3/uL (ref 0.1–1.0)
Neutro Abs: 2.9 10*3/uL (ref 1.4–7.7)
Neutrophils Relative %: 37.9 % — ABNORMAL LOW (ref 43.0–77.0)
Platelets: 310 10*3/uL (ref 150.0–400.0)
RBC: 4.52 Mil/uL (ref 3.87–5.11)
RDW: 12.9 % (ref 11.5–14.6)
WBC: 7.6 10*3/uL (ref 4.5–10.5)

## 2017-06-21 LAB — COMPREHENSIVE METABOLIC PANEL
ALK PHOS: 62 U/L (ref 39–117)
ALT: 9 U/L (ref 0–35)
AST: 11 U/L (ref 0–37)
Albumin: 4.5 g/dL (ref 3.5–5.2)
BUN: 6 mg/dL (ref 6–23)
CO2: 23 meq/L (ref 19–32)
Calcium: 9.8 mg/dL (ref 8.4–10.5)
Chloride: 107 mEq/L (ref 96–112)
Creatinine, Ser: 0.62 mg/dL (ref 0.40–1.20)
GFR: 157.7 mL/min (ref >60.00)
GLUCOSE: 86 mg/dL (ref 70–99)
Potassium: 3.4 mEq/L — ABNORMAL LOW (ref 3.5–5.1)
Sodium: 139 mEq/L (ref 135–145)
Total Bilirubin: 0.2 mg/dL (ref 0.2–1.2)
Total Protein: 7.6 g/dL (ref 6.0–8.3)

## 2017-06-21 LAB — TSH: TSH: 1.15 u[IU]/mL (ref 0.35–5.50)

## 2017-06-21 NOTE — Assessment & Plan Note (Signed)
She has always felt some fatigue, but it has been worse in the past month She is in college and currently taking classes for summer session, also works part-time Has some intermittent lightheadedness/dizziness, but this is random and there is no pattern to it Check CBC, CMP, TSH Sleep quality is good no other concerning symptoms to explain fatigue States she eats a well-rounded diet, recommended taking vitamin D 1000 units daily and vitamin B12 1000 mcg daily to see if it helps fatigue

## 2017-06-21 NOTE — Patient Instructions (Addendum)
  Test(s) ordered today. Your results will be released to MyChart (or called to you) after review, usually within 72hours after test completion. If any changes need to be made, you will be notified at that same time.    Medications reviewed and updated. Try taking vitamin D 1000 units daily and vitamin B12 1000 mcg daily.

## 2017-06-21 NOTE — Assessment & Plan Note (Signed)
Lightheadedness/dizziness that occurs intermittently No pattern No association with activity or eating Unlikely to be hypotension or hypoglycemia Check basic blood work Advised to make sure she stays well-hydrated

## 2017-06-22 ENCOUNTER — Encounter: Payer: Self-pay | Admitting: Internal Medicine

## 2017-07-28 DIAGNOSIS — H5203 Hypermetropia, bilateral: Secondary | ICD-10-CM | POA: Diagnosis not present

## 2017-07-29 ENCOUNTER — Encounter: Payer: Self-pay | Admitting: Internal Medicine

## 2017-07-31 ENCOUNTER — Ambulatory Visit (INDEPENDENT_AMBULATORY_CARE_PROVIDER_SITE_OTHER): Payer: 59 | Admitting: Family

## 2017-07-31 ENCOUNTER — Encounter: Payer: Self-pay | Admitting: Family

## 2017-07-31 VITALS — BP 122/82 | HR 97 | Temp 98.7°F | Ht 63.0 in | Wt 122.0 lb

## 2017-07-31 DIAGNOSIS — B009 Herpesviral infection, unspecified: Secondary | ICD-10-CM | POA: Diagnosis not present

## 2017-07-31 MED ORDER — ACYCLOVIR 400 MG PO TABS: 400.0000 mg | ORAL_TABLET | Freq: Four times a day (QID) | ORAL | 1 refills | Status: DC | PRN

## 2017-07-31 MED ORDER — MAGIC MOUTHWASH: 5.0000 mL | Freq: Four times a day (QID) | ORAL | 0 refills | Status: DC | PRN

## 2017-07-31 NOTE — Progress Notes (Signed)
Cindy Bates is a 22 y.o. female with the following history as recorded in [facility]:  Patient Active Problem List   Diagnosis Date Noted  . Lightheadedness 06/21/2017  . Fatigue 06/21/2017  . 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 Genetic testing 03/09/2017  . 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 Asthma 02/09/2017  . Hip pain, acute, right 02/09/2017  . Ringworm of the scalp 02/09/2017  . 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 Family history of BRCA1 gene positive   . Family history of breast cancer   . Family history of ovarian cancer     Current Outpatient Medications  Medication Sig Dispense Refill  . acyclovir (ZOVIRAX) 400 MG tablet Take 1 tablet (400 mg total) by mouth 4 (four) times daily as needed (outbreaks). 60 tablet 1  . albuterol (PROVENTIL HFA;VENTOLIN HFA) 108 (90 Base) MCG/ACT inhaler Inhale 2 puffs into the lungs every 6 (six) hours as needed for wheezing or shortness of breath. 1 Inhaler 1  . betamethasone valerate (VALISONE) 0.1 % cream 1 APPLICATION APPLY ON THE SKIN DAILY AS NEEDED  1  . betamethasone, augmented, (DIPROLENE) 0.05 % lotion APPLY TO AFFECTED AREA TWICE A DAY  2  . loratadine-pseudoephedrine (CLARITIN-D 24 HOUR) 10-240 MG 24 hr tablet Take 1 tablet by mouth daily. 60 tablet 2  . medroxyPROGESTERone (DEPO-PROVERA) 150 MG/ML injection Inject 150 mg into the muscle every 3 (three) months.    . magic mouthwash SOLN Take 5 mLs by mouth 4 (four) times daily as needed for mouth pain. Use up to 4x per day; gargle and spit 150 mL 0   No current facility-administered medications for this visit.     Allergies: Apple cider vinegar; Peanut (diagnostic); and Pear  Past Medical History:  Diagnosis Date  . Allergy   . Asthma   . Family history of BRCA1 gene positive   . Family history of breast cancer   . Family history of ovarian cancer     History reviewed. No pertinent surgical history.  Family History  Problem Relation Age of Onset  . Breast cancer Mother 39       recurred in 90's   . Hypertension Mother   . Alcohol abuse Father   . Hypertension Father   . Hypertension Maternal Grandmother   . Diabetes Maternal Grandmother   . Other Maternal Grandmother        hysterectomy in 40's  . Alcohol abuse Paternal Grandfather   . Ovarian cancer Other   . Breast cancer Other     Social History   Tobacco Use  . Smoking status: Never Smoker  . Smokeless tobacco: Never Used  Substance Use Topics  . Alcohol use: No    Frequency: Never    Subjective:  Patient has been experiencing recent HSV outbreak; lesions typically occur in roof of her mouth; is feeling better on Acyclovir but thinks she needs a refill; notes she may get a lesion one time per year; would be interested in something to help with pain until flare resolves.    Objective:  Vitals:   07/31/17 0906  BP: 122/82  Pulse: 97  Temp: 98.7 F (37.1 C)  TempSrc: Oral  SpO2: 99%  Weight: 122 lb (55.3 kg)  Height: '5\' 3"'  (1.6 m)    General: Well developed, well nourished, in no acute distress  Skin : Warm and dry.  Head: Normocephalic and atraumatic  Oropharynx: Pink, supple. No suspicious lesions; cluster of erythematous lesions noted in roof of mouth Neck: Supple without thyromegaly, adenopathy  Lungs: Respirations unlabored; clear to auscultation bilaterally without wheeze, rales, rhonchi  CVS exam: normal rate and regular rhythm.  Neurologic: Alert and oriented; speech intact; face symmetrical; moves all extremities well; CNII-XII intact without focal deficit   Assessment:  1. Herpes simplex     Plan:  Flare is resolving; refill on Acyclovir and Magic Mouthwash for pain; follow-up as needed.   No follow-ups on file.  No orders of the defined types were placed in this encounter.   Requested Prescriptions   Signed Prescriptions Disp Refills  . acyclovir (ZOVIRAX) 400 MG tablet 60 tablet 1    Sig: Take 1 tablet (400 mg total) by mouth 4 (four) times daily as needed (outbreaks).  . magic  mouthwash SOLN 150 mL 0    Sig: Take 5 mLs by mouth 4 (four) times daily as needed for mouth pain. Use up to 4x per day; gargle and spit

## 2017-08-28 DIAGNOSIS — Z3042 Encounter for surveillance of injectable contraceptive: Secondary | ICD-10-CM | POA: Diagnosis not present

## 2017-08-29 DIAGNOSIS — L659 Nonscarring hair loss, unspecified: Secondary | ICD-10-CM | POA: Diagnosis not present

## 2017-08-29 DIAGNOSIS — L658 Other specified nonscarring hair loss: Secondary | ICD-10-CM | POA: Diagnosis not present

## 2017-08-29 DIAGNOSIS — L089 Local infection of the skin and subcutaneous tissue, unspecified: Secondary | ICD-10-CM | POA: Diagnosis not present

## 2017-09-10 DIAGNOSIS — L668 Other cicatricial alopecia: Secondary | ICD-10-CM | POA: Diagnosis not present

## 2017-09-10 DIAGNOSIS — L089 Local infection of the skin and subcutaneous tissue, unspecified: Secondary | ICD-10-CM | POA: Diagnosis not present

## 2017-10-18 NOTE — Progress Notes (Signed)
Subjective:    Patient ID: Cindy Bates, female    DOB: May 15, 1995, 22 y.o.   MRN: [id number]  HPI She is here for an acute visit for cold symptoms.  Her symptoms started 5 days ago.   She is experiencing severe sore throat, swollen neck glands, difficulty swallowing at times, mild nasal congestion that improves with her allergy medication, headaches, mild cough, occasional wheeze and some shortness of breath at night sometimes.  She denies fevers, chills, ear pain, sinus pain, lightheadedness, body aches and nausea.  She has taken cold medications.  She has not needed her inhaler.  Medications and allergies reviewed with patient and updated if appropriate.  Patient Active Problem List   Diagnosis Date Noted  . Lightheadedness 06/21/2017  . Fatigue 06/21/2017  . 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 Genetic testing 03/09/2017  . 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 Asthma 02/09/2017  . Hip pain, acute, right 02/09/2017  . Ringworm of the scalp 02/09/2017  . 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 Family history of BRCA1 gene positive   . Family history of breast cancer   . Family history of ovarian cancer     Current Outpatient Medications on File Prior to Visit  Medication Sig Dispense Refill  . acyclovir (ZOVIRAX) 400 MG tablet Take 1 tablet (400 mg total) by mouth 4 (four) times daily as needed (outbreaks). 60 tablet 1  . albuterol (PROVENTIL HFA;VENTOLIN HFA) 108 (90 Base) MCG/ACT inhaler Inhale 2 puffs into the lungs every 6 (six) hours as needed for wheezing or shortness of breath. 1 Inhaler 1  . betamethasone valerate (VALISONE) 0.1 % cream 1 APPLICATION APPLY ON THE SKIN DAILY AS NEEDED  1  . betamethasone, augmented, (DIPROLENE) 0.05 % lotion APPLY TO AFFECTED AREA TWICE A DAY  2  . loratadine-pseudoephedrine (CLARITIN-D 24 HOUR) 10-240 MG 24 hr tablet Take 1 tablet by mouth daily. 60 tablet 2  . medroxyPROGESTERone (DEPO-PROVERA) 150 MG/ML injection Inject 150 mg into the muscle every 3 (three)  months.     No current facility-administered medications on file prior to visit.     Past Medical History:  Diagnosis Date  . Allergy   . Asthma   . Family history of BRCA1 gene positive   . Family history of breast cancer   . Family history of ovarian cancer     No past surgical history on file.  Social History   Socioeconomic History  . Marital status: Single    Spouse name: Not on file  . Number of children: Not on file  . Years of education: Not on file  . Highest education level: Not on file  Occupational History  . Not on file  Social Needs  . Financial resource strain: Not on file  . Food insecurity:    Worry: Not on file    Inability: Not on file  . Transportation needs:    Medical: Not on file    Non-medical: Not on file  Tobacco Use  . Smoking status: Never Smoker  . Smokeless tobacco: Never Used  Substance and Sexual Activity  . Alcohol use: No    Frequency: Never  . Drug use: No  . Sexual activity: Not on file  Lifestyle  . Physical activity:    Days per week: Not on file    Minutes per session: Not on file  . Stress: Not on file  Relationships  . Social connections:    Talks on phone: Not on file    Gets together:  Not on file    Attends religious service: Not on file    Active member of club or organization: Not on file    Attends meetings of clubs or organizations: Not on file    Relationship status: Not on file  Other Topics Concern  . Not on file  Social History Narrative  . Not on file    Family History  Problem Relation Age of Onset  . Breast cancer Mother 24       recurred in 53's  . Hypertension Mother   . Alcohol abuse Father   . Hypertension Father   . Hypertension Maternal Grandmother   . Diabetes Maternal Grandmother   . Other Maternal Grandmother        hysterectomy in 40's  . Alcohol abuse Paternal Grandfather   . Ovarian cancer Other   . Breast cancer Other     Review of Systems  Constitutional: Negative for  chills and fever.  HENT: Positive for congestion (mild), sore throat and trouble swallowing (at times). Negative for ear pain and sinus pain.   Respiratory: Positive for cough, shortness of breath (sometimes at night) and wheezing.   Gastrointestinal: Negative for diarrhea and nausea.  Musculoskeletal: Negative for myalgias.  Neurological: Positive for headaches. Negative for dizziness and light-headedness.       Objective:   Vitals:   10/19/17 0952  BP: 122/80  Pulse: 94  Resp: 16  Temp: 98.5 F (36.9 C)  SpO2: 96%   Filed Weights   10/19/17 0952  Weight: 120 lb 1.9 oz (54.5 kg)   Body mass index is 21.28 kg/m.  Wt Readings from Last 3 Encounters:  10/19/17 120 lb 1.9 oz (54.5 kg)  07/31/17 122 lb (55.3 kg)  06/21/17 124 lb (56.2 kg)     Physical Exam GENERAL APPEARANCE: Appears stated age, well appearing, NAD EYES: conjunctiva clear, no icterus HEENT: bilateral tympanic membranes and ear canals normal, oropharynx with moderate erythema, no thyromegaly, trachea midline, tender cervical lymphadenopathy LUNGS: Clear to auscultation without wheeze or crackles, unlabored breathing, good air entry bilaterally CARDIOVASCULAR: Normal S1,S2 without murmurs, no edema SKIN: warm, dry        Assessment & Plan:   See Problem List for Assessment and Plan of chronic medical problems.

## 2017-10-19 ENCOUNTER — Other Ambulatory Visit: Payer: Self-pay

## 2017-10-19 ENCOUNTER — Encounter: Payer: Self-pay | Admitting: Internal Medicine

## 2017-10-19 ENCOUNTER — Ambulatory Visit (INDEPENDENT_AMBULATORY_CARE_PROVIDER_SITE_OTHER): Payer: 59 | Admitting: Internal Medicine

## 2017-10-19 VITALS — BP 122/80 | HR 94 | Temp 98.5°F | Resp 16 | Ht 63.0 in | Wt 120.1 lb

## 2017-10-19 DIAGNOSIS — J029 Acute pharyngitis, unspecified: Secondary | ICD-10-CM

## 2017-10-19 DIAGNOSIS — J069 Acute upper respiratory infection, unspecified: Secondary | ICD-10-CM | POA: Insufficient documentation

## 2017-10-19 LAB — STREP COMPLETE PANEL
Strep Pyogenes: NEGATIVE
Strep dysgalactiae: NEGATIVE

## 2017-10-19 NOTE — Assessment & Plan Note (Signed)
Possible strep Strep panel ordered We will call her with the results and prescribe an antibiotic if needed Alternate Advil and Tylenol

## 2017-10-19 NOTE — Assessment & Plan Note (Signed)
symptoms are consistent with an upper respiratory infection or mild asthma exacerbation related to allergies or infection Albuterol as needed-lungs are currently clear Symptomatic treatment We will do a strep panel to rule out possible strep

## 2017-10-19 NOTE — Patient Instructions (Addendum)
Go to the lab for your strep test.  We will call you with the results.     Medications reviewed and updated.  Changes include :   none    Upper Respiratory Infection, Adult Most upper respiratory infections (URIs) are a viral infection of the air passages leading to the lungs. A URI affects the nose, throat, and upper air passages. The most common type of URI is nasopharyngitis and is typically referred to as "the common cold." URIs run their course and usually go away on their own. Most of the time, a URI does not require medical attention, but sometimes a bacterial infection in the upper airways can follow a viral infection. This is called a secondary infection. Sinus and middle ear infections are common types of secondary upper respiratory infections. Bacterial pneumonia can also complicate a URI. A URI can worsen asthma and chronic obstructive pulmonary disease (COPD). Sometimes, these complications can require emergency medical care and may be life threatening. What are the causes? Almost all URIs are caused by viruses. A virus is a type of germ and can spread from one person to another. What increases the risk? You may be at risk for a URI if:  You smoke.  You have chronic heart or lung disease.  You have a weakened defense (immune) system.  You are very young or very old.  You have nasal allergies or asthma.  You work in crowded or poorly ventilated areas.  You work in health care facilities or schools.  What are the signs or symptoms? Symptoms typically develop 2-3 days after you come in contact with a cold virus. Most viral URIs last 7-10 days. However, viral URIs from the influenza virus (flu virus) can last 14-18 days and are typically more severe. Symptoms may include:  Runny or stuffy (congested) nose.  Sneezing.  Cough.  Sore throat.  Headache.  Fatigue.  Fever.  Loss of appetite.  Pain in your forehead, behind your eyes, and over your cheekbones (sinus  pain).  Muscle aches.  How is this diagnosed? Your health care provider may diagnose a URI by:  Physical exam.  Tests to check that your symptoms are not due to another condition such as: ? Strep throat. ? Sinusitis. ? Pneumonia. ? Asthma.  How is this treated? A URI goes away on its own with time. It cannot be cured with medicines, but medicines may be prescribed or recommended to relieve symptoms. Medicines may help:  Reduce your fever.  Reduce your cough.  Relieve nasal congestion.  Follow these instructions at home:  Take medicines only as directed by your health care provider.  Gargle warm saltwater or take cough drops to comfort your throat as directed by your health care provider.  Use a warm mist humidifier or inhale steam from a shower to increase air moisture. This may make it easier to breathe.  Drink enough fluid to keep your urine clear or pale yellow.  Eat soups and other clear broths and maintain good nutrition.  Rest as needed.  Return to work when your temperature has returned to normal or as your health care provider advises. You may need to stay home longer to avoid infecting others. You can also use a face mask and careful hand washing to prevent spread of the virus.  Increase the usage of your inhaler if you have asthma.  Do not use any tobacco products, including cigarettes, chewing tobacco, or electronic cigarettes. If you need help quitting, ask your health care  provider. How is this prevented? The best way to protect yourself from getting a cold is to practice good hygiene.  Avoid oral or hand contact with people with cold symptoms.  Wash your hands often if contact occurs.  There is no clear evidence that vitamin C, vitamin E, echinacea, or exercise reduces the chance of developing a cold. However, it is always recommended to get plenty of rest, exercise, and practice good nutrition. Contact a health care provider if:  You are getting  worse rather than better.  Your symptoms are not controlled by medicine.  You have chills.  You have worsening shortness of breath.  You have brown or red mucus.  You have yellow or brown nasal discharge.  You have pain in your face, especially when you bend forward.  You have a fever.  You have swollen neck glands.  You have pain while swallowing.  You have white areas in the back of your throat. Get help right away if:  You have severe or persistent: ? Headache. ? Ear pain. ? Sinus pain. ? Chest pain.  You have chronic lung disease and any of the following: ? Wheezing. ? Prolonged cough. ? Coughing up blood. ? A change in your usual mucus.  You have a stiff neck.  You have changes in your: ? Vision. ? Hearing. ? Thinking. ? Mood. This information is not intended to replace advice given to you by your health care provider. Make sure you discuss any questions you have with your health care provider. Document Released: 06/28/2000 Document Revised: 09/05/2015 Document Reviewed: 04/09/2013 Elsevier Interactive Patient Education  [employer].

## 2017-10-22 ENCOUNTER — Ambulatory Visit: Payer: Self-pay | Admitting: *Deleted

## 2017-10-22 MED ORDER — AMOXICILLIN-POT CLAVULANATE 875-125 MG PO TABS: 1.0000 | ORAL_TABLET | Freq: Two times a day (BID) | ORAL | 0 refills | Status: DC

## 2017-10-22 NOTE — Telephone Encounter (Signed)
Patient was seen on Friday by Dr. Lawerance Bach with URI, strep negative. Symptoms still persist today including sore throat, headaches daily, mild nasal congestion and using allergy medication. Denies fever/body aches. She is eating soft foods and drinking. Using tylenol and OTC cold medicine with no improvement  Over the weekend. She was told to call back if no improvement in symptoms. Please advise.  Reason for Disposition . [1] Follow-up call to recent contact AND [2] information only call, no triage required  Answer Assessment - Initial Assessment Questions 1. REASON FOR CALL or QUESTION: "What is your reason for calling today?" or "How can I best help you?" or "What question do you have that I can help answer?"     Saw Dr. Lawerance Bach on Friday for URI-viral? She was told to phone back on Monday if symptoms not improved.  Protocols used: INFORMATION ONLY CALL-A-AH

## 2017-10-22 NOTE — Addendum Note (Signed)
Addended by: Pincus Sanes on: 10/22/2017 12:56 PM   Modules accepted: Orders

## 2017-10-22 NOTE — Telephone Encounter (Signed)
Lets start an antibiotic - pending -- please send to where she wants.

## 2017-10-22 NOTE — Addendum Note (Signed)
Addended by: Mercer Pod E on: 10/22/2017 02:49 PM   Modules accepted: Orders

## 2017-10-22 NOTE — Telephone Encounter (Signed)
Pt aware Rx sent to pharmacy 

## 2017-10-29 DIAGNOSIS — L658 Other specified nonscarring hair loss: Secondary | ICD-10-CM | POA: Diagnosis not present

## 2017-10-29 DIAGNOSIS — L668 Other cicatricial alopecia: Secondary | ICD-10-CM | POA: Diagnosis not present

## 2017-11-13 DIAGNOSIS — Z3042 Encounter for surveillance of injectable contraceptive: Secondary | ICD-10-CM | POA: Diagnosis not present

## 2017-12-28 DIAGNOSIS — Z6823 Body mass index (BMI) 23.0-23.9, adult: Secondary | ICD-10-CM | POA: Diagnosis not present

## 2017-12-28 DIAGNOSIS — Z118 Encounter for screening for other infectious and parasitic diseases: Secondary | ICD-10-CM | POA: Diagnosis not present

## 2017-12-28 DIAGNOSIS — Z01419 Encounter for gynecological examination (general) (routine) without abnormal findings: Secondary | ICD-10-CM | POA: Diagnosis not present

## 2017-12-31 DIAGNOSIS — L668 Other cicatricial alopecia: Secondary | ICD-10-CM | POA: Diagnosis not present

## 2017-12-31 DIAGNOSIS — L658 Other specified nonscarring hair loss: Secondary | ICD-10-CM | POA: Diagnosis not present

## 2018-01-08 ENCOUNTER — Other Ambulatory Visit: Payer: Self-pay | Admitting: Internal Medicine

## 2018-01-08 MED ORDER — ACYCLOVIR 400 MG PO TABS: 400.0000 mg | ORAL_TABLET | Freq: Four times a day (QID) | ORAL | 1 refills | Status: DC | PRN

## 2018-01-08 NOTE — Telephone Encounter (Signed)
Copied from CRM [phone]. Topic: Quick Communication - Rx Refill/Question >> Jan 08, 2018 11:07 AM [name], Cindy E wrote: Medication: acyclovir (ZOVIRAX) 400 MG tablet   Has the patient contacted their pharmacy? Yes.  (no refills) Pt is out of this medication   Preferred Pharmacy (with phone number or street name): [id number]- [place], [place] - [address] [phone] (Phone) [phone] (Fax)    Agent: Please be advised that RX refills may take up to 3 business days. We ask that you follow-up with your pharmacy.

## 2018-01-13 ENCOUNTER — Emergency Department (HOSPITAL_COMMUNITY): Payer: 59

## 2018-01-13 ENCOUNTER — Encounter (HOSPITAL_COMMUNITY): Payer: Self-pay | Admitting: Nurse Practitioner

## 2018-01-13 ENCOUNTER — Emergency Department (HOSPITAL_COMMUNITY)
Admission: EM | Admit: 2018-01-13 | Discharge: 2018-01-13 | Disposition: A | Discharge status: Home / Self Care | Payer: 59 | Attending: Emergency Medicine | Admitting: Emergency Medicine

## 2018-01-13 ENCOUNTER — Other Ambulatory Visit: Payer: Self-pay

## 2018-01-13 DIAGNOSIS — J45909 Unspecified asthma, uncomplicated: Secondary | ICD-10-CM | POA: Insufficient documentation

## 2018-01-13 DIAGNOSIS — Z79899 Other long term (current) drug therapy: Secondary | ICD-10-CM | POA: Diagnosis not present

## 2018-01-13 DIAGNOSIS — R0602 Shortness of breath: Secondary | ICD-10-CM | POA: Diagnosis not present

## 2018-01-13 DIAGNOSIS — R000 Tachycardia, unspecified: Secondary | ICD-10-CM | POA: Diagnosis not present

## 2018-01-13 DIAGNOSIS — R079 Chest pain, unspecified: Secondary | ICD-10-CM | POA: Insufficient documentation

## 2018-01-13 LAB — CBC WITH DIFFERENTIAL/PLATELET
ABS IMMATURE GRANULOCYTES: 0.01 10*3/uL (ref 0.00–0.07)
BASOS PCT: 0 %
Basophils Absolute: 0 10*3/uL (ref 0.0–0.1)
Eosinophils Absolute: 0.4 10*3/uL (ref 0.0–0.5)
Eosinophils Relative: 7 %
HCT: 38.3 % (ref 36.0–46.0)
Hemoglobin: 12.5 g/dL (ref 12.0–15.0)
Immature Granulocytes: 0 %
Lymphocytes Relative: 49 %
Lymphs Abs: 2.9 10*3/uL (ref 0.7–4.0)
MCH: 29 pg (ref 26.0–34.0)
MCHC: 32.6 g/dL (ref 30.0–36.0)
MCV: 88.9 fL (ref 80.0–100.0)
MONOS PCT: 13 %
Monocytes Absolute: 0.8 10*3/uL (ref 0.1–1.0)
Neutro Abs: 1.9 10*3/uL (ref 1.7–7.7)
Neutrophils Relative %: 31 %
Platelets: 218 10*3/uL (ref 150–400)
RBC: 4.31 MIL/uL (ref 3.87–5.11)
RDW: 11.9 % (ref 11.5–15.5)
WBC: 6 10*3/uL (ref 4.0–10.5)
nRBC: 0 % (ref 0.0–0.2)

## 2018-01-13 LAB — D-DIMER, QUANTITATIVE: D-Dimer, Quant: 0.32 ug/mL-FEU (ref 0.00–0.50)

## 2018-01-13 LAB — I-STAT TROPONIN, ED: Troponin i, poc: 0 ng/mL (ref 0.00–0.08)

## 2018-01-13 LAB — BASIC METABOLIC PANEL
Anion gap: 9 (ref 5–15)
BUN: 6 mg/dL (ref 6–20)
CO2: 22 mmol/L (ref 22–32)
Calcium: 9.4 mg/dL (ref 8.9–10.3)
Chloride: 109 mmol/L (ref 98–111)
Creatinine, Ser: 0.66 mg/dL (ref 0.44–1.00)
GFR calc Af Amer: >60 mL/min (ref >60)
GFR calc non Af Amer: >60 mL/min (ref >60)
Glucose, Bld: 97 mg/dL (ref 70–99)
Potassium: 3.4 mmol/L — ABNORMAL LOW (ref 3.5–5.1)
Sodium: 140 mmol/L (ref 135–145)

## 2018-01-13 MED ORDER — IBUPROFEN 200 MG PO TABS
600.0000 mg | ORAL_TABLET | Freq: Once | ORAL | Status: AC
  Administered 2018-01-13: 600 mg via ORAL
  Filled 2018-01-13: qty 3

## 2018-01-13 NOTE — ED Triage Notes (Signed)
Pt states 2 days ago she may have been having a heart attack, she took an Asprin that helped but today she is having a swelling sensation to her throat and shortness of breath.

## 2018-01-13 NOTE — ED Provider Notes (Addendum)
Received care of patient at 7 AM from Dr. Criss AlvineGoldston.  Please see his note for prior history and physical.  Briefly, this is a 22 year old female who presented with concern for chest pain.  Troponin within normal limits, EKG nonspecific, has low suspicion for ACS or myocarditis.  No sign of pneumonia, pneumothorax or effusion.  Patient low risk [place] with d-dimer pending.  D-dimer returned within normal limits.  Have low suspicion for pulmonary embolus.  Patient reports some swelling over her neck and tenderness over her chest wall.  I do not see any sign of significant swelling, erythema, nor abscess.  Recommend outpatient management regarding these concerns, consider outpt imaging or US. Patient discharged in stable condition with understanding of reasons to return.    Alvira MondaySchlossman, Chiquitta Matty, MD 01/13/18 1019    Alvira MondaySchlossman, Nolin Grell, MD 01/13/18 1019

## 2018-01-13 NOTE — ED Provider Notes (Signed)
[facility] DEPT Provider Note   CSN: [id number] Arrival date & time: 01/13/18  0127     History   Chief Complaint Chief Complaint  Patient presents with  . Shortness of Breath    HPI Cindy Bates is a 22 y.o. female.  HPI  22 year old female presents with chest pain.  She states that originally started a couple days ago and then went away after taking aspirin.  Then about 24 hours ago started again.  It is sharp and across her entire chest.  Occasionally radiates to her arm.  She also has some shortness of breath.  No significant cough.  She recently had a cold sore but no other recent infection.  She has not had any leg swelling, leg pain, recent travel or surgery.  She is on Depo-Provera but no estrogen.  The pain is pleuritic.  She took naproxen and it did seem to partially help.  No prior history of DVT/PE.  She denies any known risk factors for heart disease.  Past Medical History:  Diagnosis Date  . Allergy   . Asthma   . Family history of BRCA1 gene positive   . Family history of breast cancer   . Family history of ovarian cancer     Patient Active Problem List   Diagnosis Date Noted  . Sore throat 10/19/2017  . Upper respiratory tract infection 10/19/2017  . Lightheadedness 06/21/2017  . Fatigue 06/21/2017  . 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 Genetic testing 03/09/2017  . 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 Asthma 02/09/2017  . Hip pain, acute, right 02/09/2017  . Ringworm of the scalp 02/09/2017  . 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 Family history of BRCA1 gene positive   . Family history of breast cancer   . Family history of ovarian cancer     No past surgical history on file.   OB History   No obstetric history on file.      Home Medications    Prior to Admission medications   Medication Sig Start Date End Date Taking? Authorizing Provider  acyclovir (ZOVIRAX) 400 MG tablet Take 1 tablet (400 mg total) by mouth 4 (four) times daily as  needed (outbreaks). 01/08/18   Marrian Salvage, FNP  albuterol (PROVENTIL HFA;VENTOLIN HFA) 108 (90 Base) MCG/ACT inhaler Inhale 2 puffs into the lungs every 6 (six) hours as needed for wheezing or shortness of breath. 04/18/17   Marrian Salvage, FNP  amoxicillin-clavulanate (AUGMENTIN) 875-125 MG tablet Take 1 tablet by mouth 2 (two) times daily. 10/22/17   Binnie Rail, MD  betamethasone valerate (VALISONE) 0.1 % cream 1 APPLICATION APPLY ON THE SKIN DAILY AS NEEDED 03/23/17   [provider]  betamethasone, augmented, (DIPROLENE) 0.05 % lotion APPLY TO AFFECTED AREA TWICE A DAY 06/25/17   [provider]  loratadine-pseudoephedrine (CLARITIN-D 24 HOUR) 10-240 MG 24 hr tablet Take 1 tablet by mouth daily. 04/18/17   Marrian Salvage, FNP  medroxyPROGESTERone (DEPO-PROVERA) 150 MG/ML injection Inject 150 mg into the muscle every 3 (three) months.    [provider]    Family History Family History  Problem Relation Age of Onset  . Breast cancer Mother 61       recurred in 53's  . Hypertension Mother   . Alcohol abuse Father   . Hypertension Father   . Hypertension Maternal Grandmother   . Diabetes Maternal Grandmother   . Other Maternal Grandmother        hysterectomy in 40's  .  Alcohol abuse Paternal Grandfather   . Ovarian cancer Other   . Breast cancer Other     Social History Social History   Tobacco Use  . Smoking status: Never Smoker  . Smokeless tobacco: Never Used  Substance Use Topics  . Alcohol use: No    Frequency: Never  . Drug use: No     Allergies   Apple cider vinegar; Peanut (diagnostic); and Pear   Review of Systems Review of Systems  Constitutional: Negative for fever.  Respiratory: Positive for shortness of breath.   Cardiovascular: Positive for chest pain. Negative for leg swelling.  Gastrointestinal: Negative for abdominal pain and vomiting.  All other systems reviewed and are negative.    Physical  Exam Updated Vital Signs BP 127/86 (BP Location: Left Arm)   Pulse 89   Temp 98.5 F (36.9 C) (Oral)   Resp 18   Ht '5\' 3"'  (1.6 m)   Wt 51.1 kg   SpO2 100%   BMI 19.95 kg/m   Physical Exam Vitals signs and nursing note reviewed.  Constitutional:      Appearance: She is well-developed.  HENT:     Head: Normocephalic and atraumatic.     Right Ear: External ear normal.     Left Ear: External ear normal.     Nose: Nose normal.  Eyes:     General:        Right eye: No discharge.        Left eye: No discharge.  Cardiovascular:     Rate and Rhythm: Normal rate and regular rhythm.     Pulses:          Radial pulses are 2+ on the right side and 2+ on the left side.     Heart sounds: Normal heart sounds.  Pulmonary:     Effort: Pulmonary effort is normal.     Breath sounds: Normal breath sounds.  Chest:     Chest wall: Tenderness (diffuse, anterior) present.  Abdominal:     Palpations: Abdomen is soft.     Tenderness: There is no abdominal tenderness.  Musculoskeletal:     Right lower leg: No edema.     Left lower leg: No edema.  Skin:    General: Skin is warm and dry.  Neurological:     Mental Status: She is alert.  Psychiatric:        Mood and Affect: Mood is not anxious.      ED Treatments / Results  Labs (all labs ordered are listed, but only abnormal results are displayed) Labs Reviewed  BASIC METABOLIC PANEL  CBC WITH DIFFERENTIAL/PLATELET  D-DIMER, QUANTITATIVE (NOT AT [facility])  I-STAT TROPONIN, ED    EKG  ED ECG REPORT   Date: 01/13/2018  Rate: 125  Rhythm: sinus tachycardia  QRS Axis: normal  Intervals: normal  ST/T Wave abnormalities: nonspecific T wave changes  Conduction Disutrbances:none  Narrative Interpretation:   Old EKG Reviewed: none available  I have personally reviewed the EKG tracing and agree with the computerized printout as noted.   EKG Interpretation  Date/Time:  Sunday January 13 2018 06:23:58 EST Ventricular Rate:  93 PR  Interval:    QRS Duration: 86 QT Interval:  341 QTC Calculation: 425 R Axis:   58 Text Interpretation:  Sinus rhythm RSR' in V1 or V2, probably normal variant Borderline T abnormalities, anterior leads No old tracing to compare Confirmed by Sherwood Gambler [phone]) on 01/13/2018 6:39:05 AM   Radiology Dg Chest 2 View  Result Date: 01/13/2018 CLINICAL DATA:  Acute onset of shortness of breath and throat swelling. EXAM: CHEST - 2 VIEW COMPARISON:  Chest radiograph performed 02/17/2013 FINDINGS: The lungs are well-aerated and clear. There is no evidence of focal opacification, pleural effusion or pneumothorax. The heart is normal in size; the mediastinal contour is within normal limits. No acute osseous abnormalities are seen. IMPRESSION: No acute cardiopulmonary process seen. Electronically Signed   By: Garald Balding M.D.   On: 01/13/2018 06:01    Procedures Procedures (including critical care time)  Medications Ordered in ED Medications  ibuprofen (ADVIL,MOTRIN) tablet 600 mg (600 mg Oral Given 01/13/18 0640)     Initial Impression / Assessment and Plan / ED Course  I have reviewed the triage vital signs and the nursing notes.  Pertinent labs & imaging results that were available during my care of the patient were reviewed by me and considered in my medical decision making (see chart for details).     Given the significant tachycardia when she first arrived, I will rule her out for PE.  However this is much more likely to be chest wall etiology such as muscle strain or inflammation.  Troponin is negative and after multiple days of symptoms I do not think second is needed.  Care to Dr. Billy Fischer with labs pending.  Final Clinical Impressions(s) / ED Diagnoses   Final diagnoses:  None    ED Discharge Orders    None       Sherwood Gambler, MD 01/13/18 [phone]

## 2018-01-14 ENCOUNTER — Encounter: Payer: Self-pay | Admitting: Internal Medicine

## 2018-01-14 ENCOUNTER — Ambulatory Visit (INDEPENDENT_AMBULATORY_CARE_PROVIDER_SITE_OTHER): Payer: 59 | Admitting: Internal Medicine

## 2018-01-14 VITALS — BP 120/80 | HR 98 | Temp 98.9°F | Ht 63.0 in | Wt 123.0 lb

## 2018-01-14 DIAGNOSIS — R0789 Other chest pain: Secondary | ICD-10-CM | POA: Diagnosis not present

## 2018-01-14 DIAGNOSIS — B009 Herpesviral infection, unspecified: Secondary | ICD-10-CM | POA: Diagnosis not present

## 2018-01-14 DIAGNOSIS — R079 Chest pain, unspecified: Secondary | ICD-10-CM | POA: Insufficient documentation

## 2018-01-14 MED ORDER — VALACYCLOVIR HCL 1 G PO TABS: 1000.0000 mg | ORAL_TABLET | Freq: Two times a day (BID) | ORAL | 3 refills | Status: DC

## 2018-01-14 MED ORDER — MELOXICAM 15 MG PO TABS: 15.0000 mg | ORAL_TABLET | Freq: Every day | ORAL | 0 refills | Status: DC

## 2018-01-14 NOTE — Assessment & Plan Note (Signed)
Likely musculoskeletal pain, no trigger identified. Rx for meloxicam daily 2 weeks or so. Troponin, EKG, d-dimer reviewed and negative for acute etiology. Reassurance given. Lungs clear.

## 2018-01-14 NOTE — Progress Notes (Signed)
   Subjective:   Patient ID: Cindy Bates, female    DOB: 01/06/1996, 22 y.o.   MRN: [id number]  HPI The patient is a 22 YO female coming in for ER follow up (in for chest pains, started about 4 days ago, comes in spells, worse with lying on the right side, most pain now is on the right chest wall, denies pain with breathing, CXR normal, d-dimer and troponin negative in ER). Pain is overall improving gradually since onset 4 days ago. Denies fevers or chills. Denies coughing or SOB. Does have mild allergy drainage. Denies recent cold. Denies recent overuse or injury. Denies numbness or tingling in arms.  Also having a problem with cold sores and trying to take acyclovir but have problem remembering 4 times per day and wants another option. Also wants to consider preventative therapy as she has had several in the last month or two and they are painful and last awhile even with medicine. She has missed acyclovir some lately.   [facility], [facility] reviewed and updated.   Review of Systems  Constitutional: Negative.   HENT: Negative.   Eyes: Negative.   Respiratory: Negative for cough, chest tightness and shortness of breath.   Cardiovascular: Positive for chest pain. Negative for palpitations and leg swelling.  Gastrointestinal: Negative for abdominal distention, abdominal pain, constipation, diarrhea, nausea and vomiting.  Musculoskeletal: Negative.   Skin: Negative.   Neurological: Negative.   Psychiatric/Behavioral: Negative.     Objective:  Physical Exam Constitutional:      Appearance: She is well-developed.  HENT:     Head: Normocephalic and atraumatic.  Neck:     Musculoskeletal: Normal range of motion.  Cardiovascular:     Rate and Rhythm: Normal rate and regular rhythm.  Pulmonary:     Effort: Pulmonary effort is normal. No respiratory distress.     Breath sounds: Normal breath sounds. No wheezing or rales.  Chest:     Chest wall: Tenderness present.     Comments:  Tenderness right pectoral muscles Abdominal:     General: Bowel sounds are normal. There is no distension.     Palpations: Abdomen is soft.     Tenderness: There is no abdominal tenderness. There is no rebound.  Skin:    General: Skin is warm and dry.     Findings: No rash.  Neurological:     Mental Status: She is alert and oriented to person, place, and time.     Coordination: Coordination normal.     Vitals:   01/14/18 1100  BP: 120/80  Pulse: 98  Temp: 98.9 F (37.2 C)  TempSrc: Oral  SpO2: 98%  Weight: 123 lb (55.8 kg)  Height: 5\' 3"  (1.6 m)    Assessment & Plan:

## 2018-01-14 NOTE — Assessment & Plan Note (Signed)
Rx for valtrex BID with flare and daily for prevention. This will be easier for compliance to help avoid flares or pain associated.

## 2018-01-14 NOTE — Patient Instructions (Signed)
We have sent in meloxicam to take 1 pill daily.   We have also sent in valtrex to take 1 pill daily for prevention of flares. During a flare take 1 pill twice a day for 7 days or so.

## 2018-01-29 DIAGNOSIS — Z3042 Encounter for surveillance of injectable contraceptive: Secondary | ICD-10-CM | POA: Diagnosis not present

## 2018-04-04 ENCOUNTER — Other Ambulatory Visit: Payer: Self-pay

## 2018-04-04 MED ORDER — LORATADINE 10 MG PO TABS: 10.0000 mg | ORAL_TABLET | Freq: Every day | ORAL | 0 refills | Status: DC | PRN

## 2018-04-09 ENCOUNTER — Encounter: Payer: Self-pay | Admitting: Internal Medicine

## 2018-04-09 ENCOUNTER — Other Ambulatory Visit: Payer: Self-pay | Admitting: Internal Medicine

## 2018-04-09 MED ORDER — LORATADINE 10 MG PO TABS: 10.0000 mg | ORAL_TABLET | Freq: Every day | ORAL | 0 refills | Status: DC | PRN

## 2018-04-10 ENCOUNTER — Other Ambulatory Visit: Payer: Self-pay

## 2018-04-10 MED ORDER — LORATADINE 10 MG PO TABS: 10.0000 mg | ORAL_TABLET | Freq: Every day | ORAL | 0 refills | Status: DC | PRN

## 2018-04-16 DIAGNOSIS — Z3042 Encounter for surveillance of injectable contraceptive: Secondary | ICD-10-CM | POA: Diagnosis not present

## 2018-06-15 DIAGNOSIS — H5203 Hypermetropia, bilateral: Secondary | ICD-10-CM | POA: Diagnosis not present

## 2018-06-21 ENCOUNTER — Encounter: Payer: Self-pay | Admitting: Internal Medicine

## 2018-06-21 ENCOUNTER — Encounter: Payer: Self-pay | Admitting: Family

## 2018-06-21 ENCOUNTER — Other Ambulatory Visit: Payer: Self-pay | Admitting: Internal Medicine

## 2018-06-21 MED ORDER — LORATADINE-PSEUDOEPHEDRINE ER 10-240 MG PO TB24: 1.0000 | ORAL_TABLET | Freq: Every day | ORAL | 0 refills | Status: DC

## 2018-06-22 ENCOUNTER — Encounter: Payer: Self-pay | Admitting: Internal Medicine

## 2018-06-22 ENCOUNTER — Other Ambulatory Visit: Payer: Self-pay | Admitting: Internal Medicine

## 2018-06-24 MED ORDER — LORATADINE-PSEUDOEPHEDRINE ER 10-240 MG PO TB24: 1.0000 | ORAL_TABLET | Freq: Every day | ORAL | 0 refills | Status: DC

## 2018-06-25 NOTE — Progress Notes (Signed)
Subjective:    Patient ID: Cindy Bates, female    DOB: 01-24-1995, 23 y.o.   MRN: [id number]  HPI She is here for a physical exam.   Her biggest concern is her sleep - it is terrible.  She has been taking melatonin a lot and she stopped taking it because she thought she was dependent on it.  Her sleep pattern is not good. In march she started taking benadryl and ended up taking 6 pills at night and she stopped that.  That is when she started melatonin.  She was sleeping intermittent at night and was napping during the day.  He was waking up frequently at night and having difficulty getting back to sleep.  This summer - she is taking two summer classes and an Therapist, sports.    She drinks caffeine only in the morning.   Her memory is bad.  She forgets things that someone tells her - she forgets the simple things.    She feels anxious.  She tries not to stress. She denies depression - maybe sometimes.    She has to check the front and back doors and check the car doors.  She thinks she has some obsessive tendencies.  Medications and allergies reviewed with patient and updated if appropriate.  Patient Active Problem List   Diagnosis Date Noted  . Chest pain 01/14/2018  . Sore throat 10/19/2017  . Lightheadedness 06/21/2017  . Fatigue 06/21/2017  . 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 Genetic testing 03/09/2017  . 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 Asthma 02/09/2017  . Hip pain, acute, right 02/09/2017  . Ringworm of the scalp 02/09/2017  . 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 Family history of BRCA1 gene positive   . Family history of breast cancer   . Family history of ovarian cancer     Current Outpatient Medications on File Prior to Visit  Medication Sig Dispense Refill  . albuterol (PROVENTIL HFA;VENTOLIN HFA) 108 (90 Base) MCG/ACT inhaler Inhale 2 puffs into the lungs every 6 (six) hours as needed for wheezing or shortness of breath. 1 Inhaler 1  . loratadine-pseudoephedrine (CLARITIN-D 24  HOUR) 10-240 MG 24 hr tablet Take 1 tablet by mouth daily. Annual appt is due must see provider for future refills 30 tablet 0  . medroxyPROGESTERone (DEPO-PROVERA) 150 MG/ML injection Inject 150 mg into the muscle every 3 (three) months.    . Multiple Vitamin (MULTIVITAMIN WITH MINERALS) TABS tablet Take 1 tablet by mouth 2 (two) times daily.    . valACYclovir (VALTREX) 1000 MG tablet Take 1 tablet (1,000 mg total) by mouth 2 (two) times daily. 60 tablet 3   No current facility-administered medications on file prior to visit.     Past Medical History:  Diagnosis Date  . Allergy   . Asthma   . Family history of BRCA1 gene positive   . Family history of breast cancer   . Family history of ovarian cancer     No past surgical history on file.  Social History   Socioeconomic History  . Marital status: Single    Spouse name: Not on file  . Number of children: Not on file  . Years of education: Not on file  . Highest education level: Not on file  Occupational History  . Not on file  Social Needs  . Financial resource strain: Not on file  . Food insecurity:    Worry: Not on file    Inability: Not on file  . Transportation  needs:    Medical: Not on file    Non-medical: Not on file  Tobacco Use  . Smoking status: Never Smoker  . Smokeless tobacco: Never Used  Substance and Sexual Activity  . Alcohol use: No    Frequency: Never  . Drug use: No  . Sexual activity: Not on file  Lifestyle  . Physical activity:    Days per week: Not on file    Minutes per session: Not on file  . Stress: Not on file  Relationships  . Social connections:    Talks on phone: Not on file    Gets together: Not on file    Attends religious service: Not on file    Active member of club or organization: Not on file    Attends meetings of clubs or organizations: Not on file    Relationship status: Not on file  Other Topics Concern  . Not on file  Social History Narrative  . Not on file     Family History  Problem Relation Age of Onset  . Breast cancer Mother 68       recurred in 16's  . Hypertension Mother   . Alcohol abuse Father   . Hypertension Father   . Hypertension Maternal Grandmother   . Diabetes Maternal Grandmother   . Other Maternal Grandmother        hysterectomy in 40's  . Alcohol abuse Paternal Grandfather   . Ovarian cancer Other   . Breast cancer Other     Review of Systems  Constitutional: Negative for chills and fever.  Eyes: Negative for visual disturbance.  Respiratory: Negative for cough, shortness of breath and wheezing.   Cardiovascular: Negative for chest pain, palpitations and leg swelling.  Gastrointestinal: Negative for abdominal pain, blood in stool, constipation, diarrhea and nausea.  Genitourinary: Negative for dysuria and hematuria.  Musculoskeletal: Positive for arthralgias (knees). Negative for back pain.  Skin: Negative for color change and rash.  Neurological: Negative for light-headedness and headaches.  Psychiatric/Behavioral: Positive for dysphoric mood and sleep disturbance. The patient is nervous/anxious.        Objective:   Vitals:   06/26/18 1416  BP: 108/72  Pulse: 100  Resp: 16  Temp: 99.4 F (37.4 C)  SpO2: 98%   Filed Weights   06/26/18 1416  Weight: 127 lb (57.6 kg)   Body mass index is 22.5 kg/m.  BP Readings from Last 3 Encounters:  06/26/18 108/72  01/14/18 120/80  01/13/18 138/75    Wt Readings from Last 3 Encounters:  06/26/18 127 lb (57.6 kg)  01/14/18 123 lb (55.8 kg)  01/13/18 112 lb 9.6 oz (51.1 kg)     Physical Exam Constitutional: She appears well-developed and well-nourished. No distress.  HENT:  Head: Normocephalic and atraumatic.  Right Ear: External ear normal. Normal ear canal and TM Left Ear: External ear normal.  Normal ear canal and TM Mouth/Throat: Oropharynx is clear and moist.  Eyes: Conjunctivae and EOM are normal.  Neck: Neck supple. No tracheal deviation present.  thyromegaly present.  No carotid bruit  Cardiovascular: Normal rate, regular rhythm and normal heart sounds.   No murmur heard.  No edema. Pulmonary/Chest: Effort normal and breath sounds normal. No respiratory distress. She has no wheezes. She has no rales.  Breast: deferred   Abdominal: Soft. She exhibits no distension. There is no tenderness.  Lymphadenopathy: She has no cervical adenopathy.  Skin: Skin is warm and dry. She is not diaphoretic.  Psychiatric: She has  a normal mood and affect. Her behavior is normal.        Assessment & Plan:   Physical exam: Screening blood work ordered Immunizations    up to date Gyn    Up to date  Exercise   none Weight   Normal BMI Skin no concerns Substance abuse  none  See Problem List for Assessment and Plan of chronic medical problems.   Follow-up in 1 month, sooner if needed for anxiety

## 2018-06-25 NOTE — Patient Instructions (Addendum)
All other Health Maintenance issues reviewed.   All recommended immunizations and age-appropriate screenings are up-to-date or discussed.  No immunizations administered today.   Medications reviewed and updated.  Changes include :  paxil 20 mg daily   Your prescription(s) have been submitted to your pharmacy. Please take as directed and contact our office if you believe you are having problem(s) with the medication(s).   Please followup in one month   Health Maintenance, Female Adopting a healthy lifestyle and getting preventive care can go a long way to promote health and wellness. Talk with your health care provider about what schedule of regular examinations is right for you. This is a good chance for you to check in with your provider about disease prevention and staying healthy. In between checkups, there are plenty of things you can do on your own. Experts have done a lot of research about which lifestyle changes and preventive measures are most likely to keep you healthy. Ask your health care provider for more information. Weight and diet Eat a healthy diet  Be sure to include plenty of vegetables, fruits, low-fat dairy products, and lean protein.  Do not eat a lot of foods high in solid fats, added sugars, or salt.  Get regular exercise. This is one of the most important things you can do for your health. ? Most adults should exercise for at least 150 minutes each week. The exercise should increase your heart rate and make you sweat (moderate-intensity exercise). ? Most adults should also do strengthening exercises at least twice a week. This is in addition to the moderate-intensity exercise. Maintain a healthy weight  Body mass index (BMI) is a measurement that can be used to identify possible weight problems. It estimates body fat based on height and weight. Your health care provider can help determine your BMI and help you achieve or maintain a healthy weight.  For females  38 years of age and older: ? A BMI below 18.5 is considered underweight. ? A BMI of 18.5 to 24.9 is normal. ? A BMI of 25 to 29.9 is considered overweight. ? A BMI of 30 and above is considered obese. Watch levels of cholesterol and blood lipids  You should start having your blood tested for lipids and cholesterol at 21 years of age, then have this test every 5 years.  You may need to have your cholesterol levels checked more often if: ? Your lipid or cholesterol levels are high. ? You are older than 21 years of age. ? You are at high risk for heart disease. Cancer screening Lung Cancer  Lung cancer screening is recommended for adults 70-62 years old who are at high risk for lung cancer because of a history of smoking.  A yearly low-dose CT scan of the lungs is recommended for people who: ? Currently smoke. ? Have quit within the past 15 years. ? Have at least a 30-pack-year history of smoking. A pack year is smoking an average of one pack of cigarettes a day for 1 year.  Yearly screening should continue until it has been 15 years since you quit.  Yearly screening should stop if you develop a health problem that would prevent you from having lung cancer treatment. Breast Cancer  Practice breast self-awareness. This means understanding how your breasts normally appear and feel.  It also means doing regular breast self-exams. Let your health care provider know about any changes, no matter how small.  If you are in your 59s or  22s, you should have a clinical breast exam (CBE) by a health care provider every 1-3 years as part of a regular health exam.  If you are 42 or older, have a CBE every year. Also consider having a breast X-ray (mammogram) every year.  If you have a family history of breast cancer, talk to your health care provider about genetic screening.  If you are at high risk for breast cancer, talk to your health care provider about having an MRI and a mammogram every  year.  Breast cancer gene (BRCA) assessment is recommended for women who have family members with BRCA-related cancers. BRCA-related cancers include: ? Breast. ? Ovarian. ? Tubal. ? Peritoneal cancers.  Results of the assessment will determine the need for genetic counseling and BRCA1 and BRCA2 testing. Cervical Cancer Your health care provider may recommend that you be screened regularly for cancer of the pelvic organs (ovaries, uterus, and vagina). This screening involves a pelvic examination, including checking for microscopic changes to the surface of your cervix (Pap test). You may be encouraged to have this screening done every 3 years, beginning at age 31.  For women ages 72-65, health care providers may recommend pelvic exams and Pap testing every 3 years, or they may recommend the Pap and pelvic exam, combined with testing for human papilloma virus (HPV), every 5 years. Some types of HPV increase your risk of cervical cancer. Testing for HPV may also be done on women of any age with unclear Pap test results.  Other health care providers may not recommend any screening for nonpregnant women who are considered low risk for pelvic cancer and who do not have symptoms. Ask your health care provider if a screening pelvic exam is right for you.  If you have had past treatment for cervical cancer or a condition that could lead to cancer, you need Pap tests and screening for cancer for at least 20 years after your treatment. If Pap tests have been discontinued, your risk factors (such as having a new sexual partner) need to be reassessed to determine if screening should resume. Some women have medical problems that increase the chance of getting cervical cancer. In these cases, your health care provider may recommend more frequent screening and Pap tests. Colorectal Cancer  This type of cancer can be detected and often prevented.  Routine colorectal cancer screening usually begins at 21 years of  age and continues through 21 years of age.  Your health care provider may recommend screening at an earlier age if you have risk factors for colon cancer.  Your health care provider may also recommend using home test kits to check for hidden blood in the stool.  A small camera at the end of a tube can be used to examine your colon directly (sigmoidoscopy or colonoscopy). This is done to check for the earliest forms of colorectal cancer.  Routine screening usually begins at age 26.  Direct examination of the colon should be repeated every 5-10 years through 21 years of age. However, you may need to be screened more often if early forms of precancerous polyps or small growths are found. Skin Cancer  Check your skin from head to toe regularly.  Tell your health care provider about any new moles or changes in moles, especially if there is a change in a mole's shape or color.  Also tell your health care provider if you have a mole that is larger than the size of a pencil eraser.  Always  use sunscreen. Apply sunscreen liberally and repeatedly throughout the day.  Protect yourself by wearing long sleeves, pants, a wide-brimmed hat, and sunglasses whenever you are outside. Heart disease, diabetes, and high blood pressure  High blood pressure causes heart disease and increases the risk of stroke. High blood pressure is more likely to develop in: ? People who have blood pressure in the high end of the normal range (130-139/85-89 mm Hg). ? People who are overweight or obese. ? People who are African American.  If you are 44-31 years of age, have your blood pressure checked every 3-5 years. If you are 71 years of age or older, have your blood pressure checked every year. You should have your blood pressure measured twice-once when you are at a hospital or clinic, and once when you are not at a hospital or clinic. Record the average of the two measurements. To check your blood pressure when you are  not at a hospital or clinic, you can use: ? An automated blood pressure machine at a pharmacy. ? A home blood pressure monitor.  If you are between 17 years and 60 years old, ask your health care provider if you should take aspirin to prevent strokes.  Have regular diabetes screenings. This involves taking a blood sample to check your fasting blood sugar level. ? If you are at a normal weight and have a low risk for diabetes, have this test once every three years after 21 years of age. ? If you are overweight and have a high risk for diabetes, consider being tested at a younger age or more often. Preventing infection Hepatitis B  If you have a higher risk for hepatitis B, you should be screened for this virus. You are considered at high risk for hepatitis B if: ? You were born in a country where hepatitis B is common. Ask your health care provider which countries are considered high risk. ? Your parents were born in a high-risk country, and you have not been immunized against hepatitis B (hepatitis B vaccine). ? You have HIV or AIDS. ? You use needles to inject street drugs. ? You live with someone who has hepatitis B. ? You have had sex with someone who has hepatitis B. ? You get hemodialysis treatment. ? You take certain medicines for conditions, including cancer, organ transplantation, and autoimmune conditions. Hepatitis C  Blood testing is recommended for: ? Everyone born from [birth year] through 1963. ? Anyone with known risk factors for hepatitis C. Sexually transmitted infections (STIs)  You should be screened for sexually transmitted infections (STIs) including gonorrhea and chlamydia if: ? You are sexually active and are younger than 21 years of age. ? You are older than 21 years of age and your health care provider tells you that you are at risk for this type of infection. ? Your sexual activity has changed since you were last screened and you are at an increased risk for chlamydia  or gonorrhea. Ask your health care provider if you are at risk.  If you do not have HIV, but are at risk, it may be recommended that you take a prescription medicine daily to prevent HIV infection. This is called pre-exposure prophylaxis (PrEP). You are considered at risk if: ? You are sexually active and do not regularly use condoms or know the HIV status of your partner(s). ? You take drugs by injection. ? You are sexually active with a partner who has HIV. Talk with your health care provider about  whether you are at high risk of being infected with HIV. If you choose to begin PrEP, you should first be tested for HIV. You should then be tested every 3 months for as long as you are taking PrEP. Pregnancy  If you are premenopausal and you may become pregnant, ask your health care provider about preconception counseling.  If you may become pregnant, take 400 to 800 micrograms (mcg) of folic acid every day.  If you want to prevent pregnancy, talk to your health care provider about birth control (contraception). Osteoporosis and menopause  Osteoporosis is a disease in which the bones lose minerals and strength with aging. This can result in serious bone fractures. Your risk for osteoporosis can be identified using a bone density scan.  If you are 84 years of age or older, or if you are at risk for osteoporosis and fractures, ask your health care provider if you should be screened.  Ask your health care provider whether you should take a calcium or vitamin D supplement to lower your risk for osteoporosis.  Menopause may have certain physical symptoms and risks.  Hormone replacement therapy may reduce some of these symptoms and risks. Talk to your health care provider about whether hormone replacement therapy is right for you. Follow these instructions at home:  Schedule regular health, dental, and eye exams.  Stay current with your immunizations.  Do not use any tobacco products including  cigarettes, chewing tobacco, or electronic cigarettes.  If you are pregnant, do not drink alcohol.  If you are breastfeeding, limit how much and how often you drink alcohol.  Limit alcohol intake to no more than 1 drink per day for nonpregnant women. One drink equals 12 ounces of beer, 5 ounces of wine, or 1 ounces of hard liquor.  Do not use street drugs.  Do not share needles.  Ask your health care provider for help if you need support or information about quitting drugs.  Tell your health care provider if you often feel depressed.  Tell your health care provider if you have ever been abused or do not feel safe at home. This information is not intended to replace advice given to you by your health care provider. Make sure you discuss any questions you have with your health care provider. Document Released: 07/18/2010 Document Revised: 06/10/2015 Document Reviewed: 10/06/2014 Elsevier Interactive Patient Education  2019 [employer].

## 2018-06-26 ENCOUNTER — Encounter: Payer: Self-pay | Admitting: Internal Medicine

## 2018-06-26 ENCOUNTER — Ambulatory Visit (INDEPENDENT_AMBULATORY_CARE_PROVIDER_SITE_OTHER): Payer: 59 | Admitting: Internal Medicine

## 2018-06-26 ENCOUNTER — Other Ambulatory Visit: Payer: Self-pay

## 2018-06-26 VITALS — BP 108/72 | HR 100 | Temp 99.4°F | Resp 16 | Ht 63.0 in | Wt 127.0 lb

## 2018-06-26 DIAGNOSIS — F419 Anxiety disorder, unspecified: Secondary | ICD-10-CM

## 2018-06-26 DIAGNOSIS — E010 Iodine-deficiency related diffuse (endemic) goiter: Secondary | ICD-10-CM

## 2018-06-26 DIAGNOSIS — G479 Sleep disorder, unspecified: Secondary | ICD-10-CM | POA: Diagnosis not present

## 2018-06-26 DIAGNOSIS — J4520 Mild intermittent asthma, uncomplicated: Secondary | ICD-10-CM

## 2018-06-26 DIAGNOSIS — Z0000 Encounter for general adult medical examination without abnormal findings: Secondary | ICD-10-CM

## 2018-06-26 DIAGNOSIS — F32A Depression, unspecified: Secondary | ICD-10-CM | POA: Insufficient documentation

## 2018-06-26 DIAGNOSIS — R69 Illness, unspecified: Secondary | ICD-10-CM | POA: Diagnosis not present

## 2018-06-26 MED ORDER — PAROXETINE HCL 20 MG PO TABS: 20.0000 mg | ORAL_TABLET | Freq: Every day | ORAL | 5 refills | Status: DC

## 2018-06-26 NOTE — Assessment & Plan Note (Signed)
Overall controlled Using albuterol as needed Taking allergy medications

## 2018-06-26 NOTE — Assessment & Plan Note (Signed)
Chronic, stable Ultrasound last year normal

## 2018-06-26 NOTE — Assessment & Plan Note (Signed)
Has had sleep difficulties for a while, which are likely combination of poor sleep hygiene and anxiety Discussed sleep hygiene at length We will treat anxiety Follow-up in 4 weeks Stressed to avoid Benadryl and too much melatonin

## 2018-06-26 NOTE — Assessment & Plan Note (Signed)
She is having increased anxiety and some OCD-like tendencies  Her anxiety could be affecting her sleep Discussed options-she does not want to see a therapist She is interested in trying medication Start Paxil 20 mg daily Follow-up in 1 month Discussed possible side effects-she will call with any questions or concerns

## 2018-06-27 ENCOUNTER — Encounter: Payer: Self-pay | Admitting: Internal Medicine

## 2018-07-02 DIAGNOSIS — Z3042 Encounter for surveillance of injectable contraceptive: Secondary | ICD-10-CM | POA: Diagnosis not present

## 2018-07-08 DIAGNOSIS — L089 Local infection of the skin and subcutaneous tissue, unspecified: Secondary | ICD-10-CM | POA: Diagnosis not present

## 2018-07-08 DIAGNOSIS — L669 Cicatricial alopecia, unspecified: Secondary | ICD-10-CM | POA: Diagnosis not present

## 2018-07-18 ENCOUNTER — Other Ambulatory Visit: Payer: Self-pay | Admitting: Internal Medicine

## 2018-07-25 NOTE — Progress Notes (Signed)
  Subjective:    Patient ID: Cindy Bates, female    DOB: 07/03/1995, 23 y.o.   MRN: [id number]  HPI The patient is here for follow up.  Anxiety, OCD: 4 weeks ago we started her on Paxil for anxiety and possible OCD.  She is taking her medication daily as prescribed.  She initially had some nausea and did decrease the medication to 10 mg for a few days and then was able to increase it back to 20 mg.  The nausea has resolved.  She denies any side effects from the medication now. She feels her anxiety is better and she is no longer doing her OCD tendencies.  She denies any depression.  When she was here last she was not sleeping well and she states she is now sleeping well without difficulty.  She does feel the anxiety start to return in the evening like the medication is wearing off, but she thinks it is tolerable.  She does still have a lot going on.  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  Plans on going to law school after her last year of college.    Medications and allergies reviewed with patient and updated if appropriate.  Patient Active Problem List   Diagnosis Date Noted  . Anxiety 06/26/2018  . Sleep difficulties 06/26/2018  . Lightheadedness 06/21/2017  . Fatigue 06/21/2017  . 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 Genetic testing 03/09/2017  . 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 Asthma 02/09/2017  . Hip pain, acute, right 02/09/2017  . 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 Family history of BRCA1 gene positive   . Family history of breast cancer   . Family history of ovarian cancer     Current Outpatient Medications on File Prior to Visit  Medication Sig Dispense Refill  . albuterol (PROVENTIL HFA;VENTOLIN HFA) 108 (90 Base) MCG/ACT inhaler Inhale 2 puffs into the lungs every 6 (six) hours as needed for wheezing or shortness of breath. 1 Inhaler 1  . loratadine-pseudoephedrine (CLARITIN-D 24 HOUR) 10-240 MG 24 hr tablet Take 1 tablet by mouth daily. Annual appt is due must see  provider for future refills 30 tablet 0  . medroxyPROGESTERone (DEPO-PROVERA) 150 MG/ML injection Inject 150 mg into the muscle every 3 (three) months.    . Multiple Vitamin (MULTIVITAMIN WITH MINERALS) TABS tablet Take 1 tablet by mouth 2 (two) times daily.    . PARoxetine (PAXIL) 20 MG tablet TAKE 1 TABLET BY MOUTH EVERY DAY 90 tablet 2  . valACYclovir (VALTREX) 1000 MG tablet Take 1 tablet (1,000 mg total) by mouth 2 (two) times daily. 60 tablet 3   No current facility-administered medications on file prior to visit.     Past Medical History:  Diagnosis Date  . Allergy   . Asthma   . Family history of BRCA1 gene positive   . Family history of breast cancer   . Family history of ovarian cancer     No past surgical history on file.  Social History   Socioeconomic History  . Marital status: Single    Spouse name: Not on file  . Number of children: Not on file  . Years of education: Not on file  . Highest education level: Not on file  Occupational History  . Not on file  Social Needs  . Financial resource strain: Not on file  . Food insecurity    Worry: Not on file    Inability: Not on file  . Transportation needs      Medical: Not on file    Non-medical: Not on file  Tobacco Use  . Smoking status: Never Smoker  . Smokeless tobacco: Never Used  Substance and Sexual Activity  . Alcohol use: No    Frequency: Never  . Drug use: No  . Sexual activity: Not on file  Lifestyle  . Physical activity    Days per week: Not on file    Minutes per session: Not on file  . Stress: Not on file  Relationships  . Social connections    Talks on phone: Not on file    Gets together: Not on file    Attends religious service: Not on file    Active member of club or organization: Not on file    Attends meetings of clubs or organizations: Not on file    Relationship status: Not on file  Other Topics Concern  . Not on file  Social History Narrative  . Not on file    Family History   Problem Relation Age of Onset  . Breast cancer Mother 29       recurred in 40's  . Hypertension Mother   . Alcohol abuse Father   . Hypertension Father   . Hypertension Maternal Grandmother   . Diabetes Maternal Grandmother   . Other Maternal Grandmother        hysterectomy in 40's  . Alcohol abuse Paternal Grandfather   . Ovarian cancer Other   . Breast cancer Other     Review of Systems  Constitutional: Negative for appetite change.  Cardiovascular: Negative for chest pain and palpitations.  Gastrointestinal: Negative for nausea.  Neurological: Negative for headaches.  Psychiatric/Behavioral: Negative for sleep disturbance.       Objective:   Vitals:   07/26/18 1055  BP: 134/84  Pulse: (!) 106  Resp: 16  Temp: 99.1 F (37.3 C)  SpO2: 98%   BP Readings from Last 3 Encounters:  07/26/18 134/84  06/26/18 108/72  01/14/18 120/80   Wt Readings from Last 3 Encounters:  07/26/18 121 lb (54.9 kg)  06/26/18 127 lb (57.6 kg)  01/14/18 123 lb (55.8 kg)   Body mass index is 21.43 kg/m.   Physical Exam Constitutional:      General: She is not in acute distress.    Appearance: Normal appearance. She is not ill-appearing.  Skin:    General: Skin is warm and dry.  Neurological:     Mental Status: She is alert.  Psychiatric:        Mood and Affect: Mood normal.        Behavior: Behavior normal.        Thought Content: Thought content normal.        Judgment: Judgment normal.            Assessment & Plan:    See Problem List for Assessment and Plan of chronic medical problems.   

## 2018-07-26 ENCOUNTER — Other Ambulatory Visit: Payer: Self-pay

## 2018-07-26 ENCOUNTER — Encounter: Payer: Self-pay | Admitting: Internal Medicine

## 2018-07-26 ENCOUNTER — Ambulatory Visit (INDEPENDENT_AMBULATORY_CARE_PROVIDER_SITE_OTHER): Payer: 59 | Admitting: Internal Medicine

## 2018-07-26 VITALS — BP 134/84 | HR 106 | Temp 99.1°F | Resp 16 | Ht 63.0 in | Wt 121.0 lb

## 2018-07-26 DIAGNOSIS — R69 Illness, unspecified: Secondary | ICD-10-CM | POA: Diagnosis not present

## 2018-07-26 DIAGNOSIS — G479 Sleep disorder, unspecified: Secondary | ICD-10-CM

## 2018-07-26 DIAGNOSIS — F419 Anxiety disorder, unspecified: Secondary | ICD-10-CM | POA: Diagnosis not present

## 2018-07-26 NOTE — Assessment & Plan Note (Signed)
Was having significant sleep difficulties for a while Anxiety was definitely contributing  Sleep much improved with Paxil daily Continue Paxil

## 2018-07-26 NOTE — Patient Instructions (Signed)
Continue your medications as is.    Follow up in 6 months.   Call or send a note via mychart with any concerns.

## 2018-07-26 NOTE — Assessment & Plan Note (Signed)
Anxiety improved with Paxil daily and she is no longer doing her OCD tendencies Discussed increasing Paxil versus keeping at the same.  She does have recent anxiety in the evening and feels the medication has worn off a little, but this is tolerable She will stay at her current dose and continually evaluate if she needs a higher dose Overall seems to be well controlled Follow-up in 6 months, sooner if needed

## 2018-08-18 ENCOUNTER — Other Ambulatory Visit: Payer: Self-pay | Admitting: Internal Medicine

## 2018-08-19 ENCOUNTER — Other Ambulatory Visit: Payer: Self-pay | Admitting: Internal Medicine

## 2018-08-19 MED ORDER — CLARITIN-D 24 HOUR 10-240 MG PO TB24: 1.0000 | ORAL_TABLET | Freq: Every day | ORAL | 0 refills | Status: DC

## 2018-08-20 MED ORDER — CLARITIN-D 24 HOUR 10-240 MG PO TB24: 1.0000 | ORAL_TABLET | Freq: Every day | ORAL | 3 refills | Status: AC

## 2018-08-27 IMAGING — US US THYROID
1 series · 14 of 25 positions shown · non-contrast
Comparison: None.

CLINICAL DATA: Thyromegaly

EXAM:
THYROID ULTRASOUND
TECHNIQUE: Ultrasound examination of the thyroid gland and adjacent soft
tissues was performed.

[Series 1: us thyroid · 0.04mm/px · 14 of 44 slices shown]
[im 1/44]
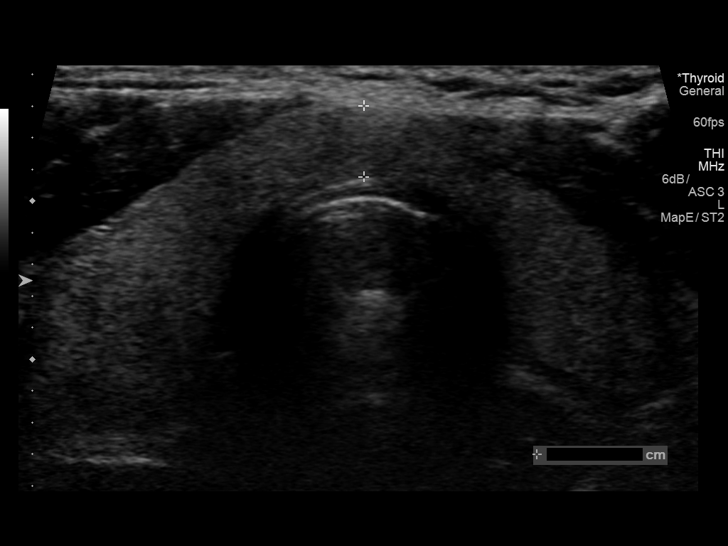
[im 4/44]
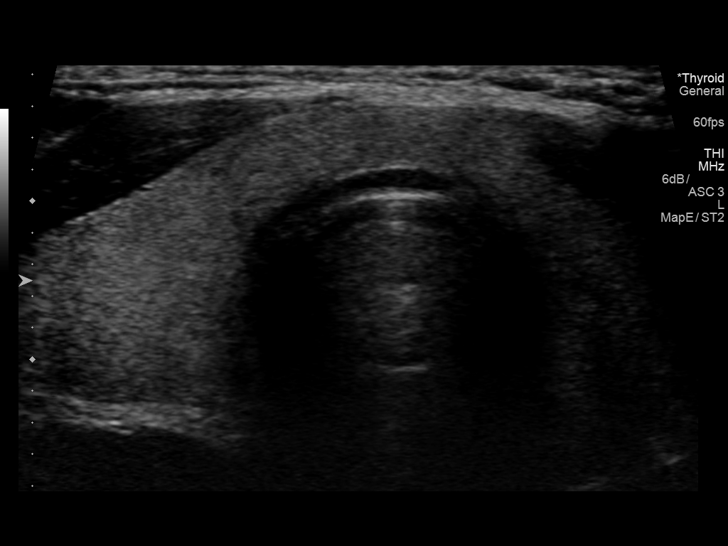
[im 8/44]
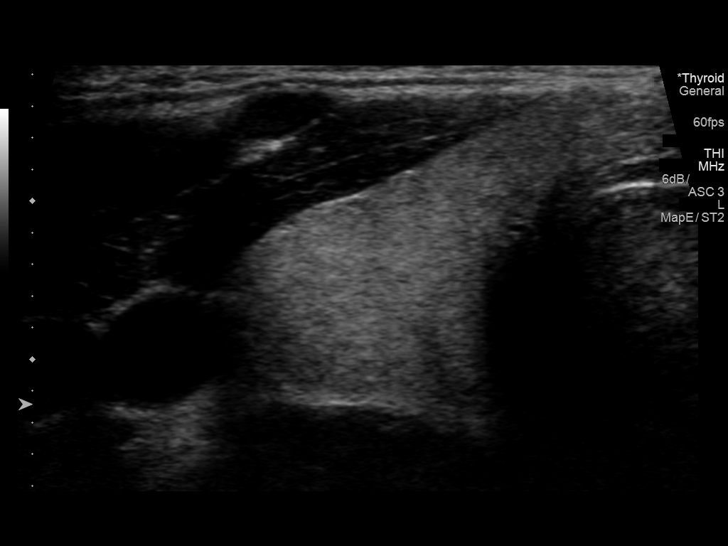
[im 11/44]
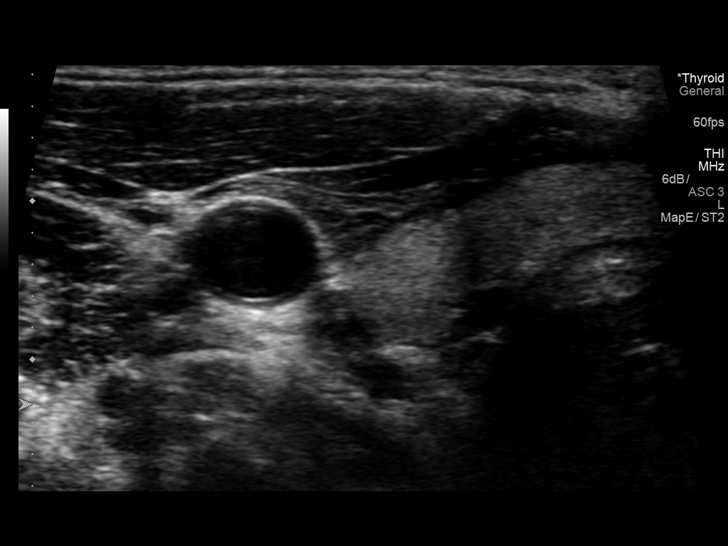
[im 15/44]
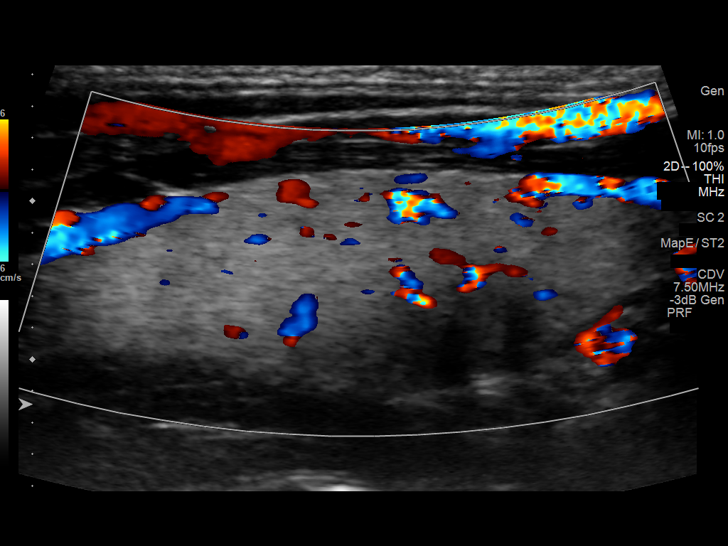
[im 17/44]
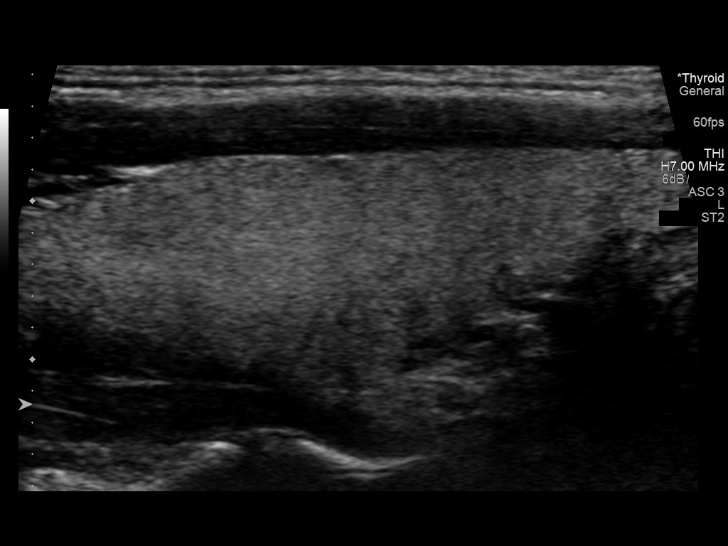
[im 20/44]
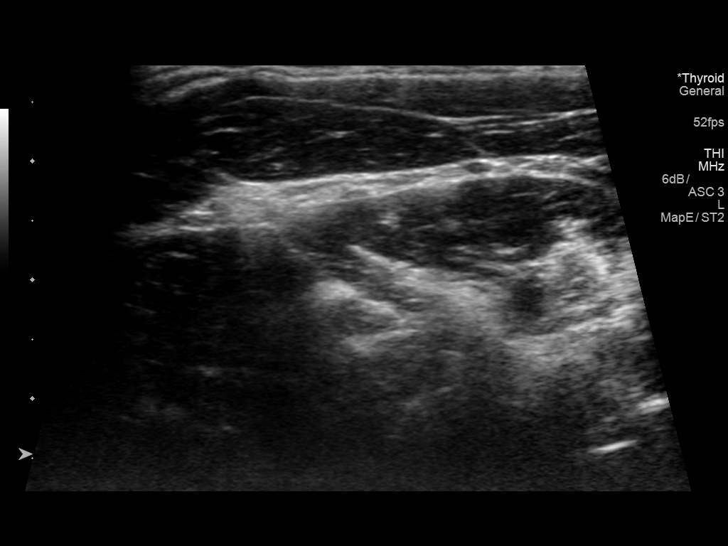
[im 24/44]
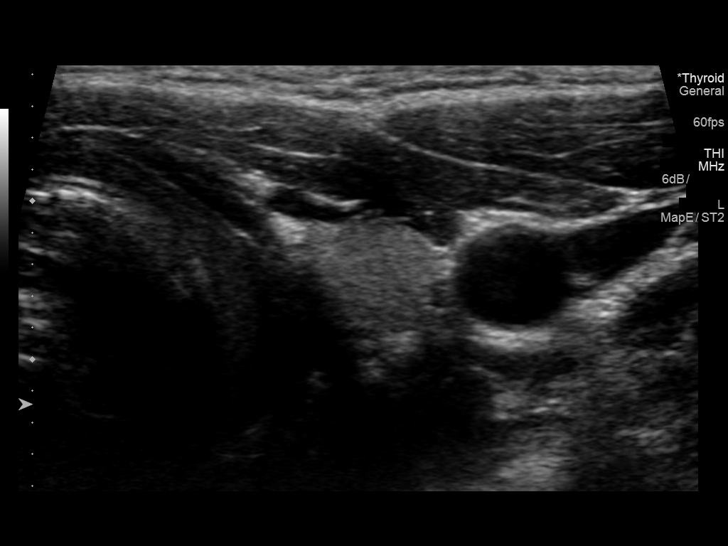
[im 27/44]
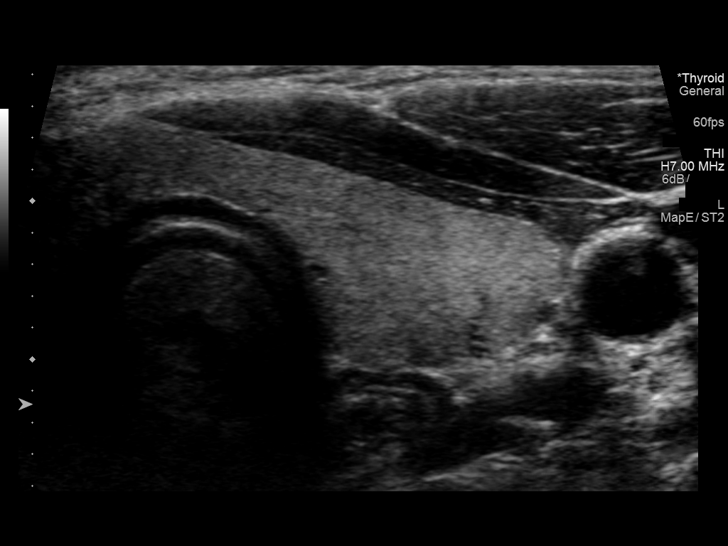
[im 29/44]
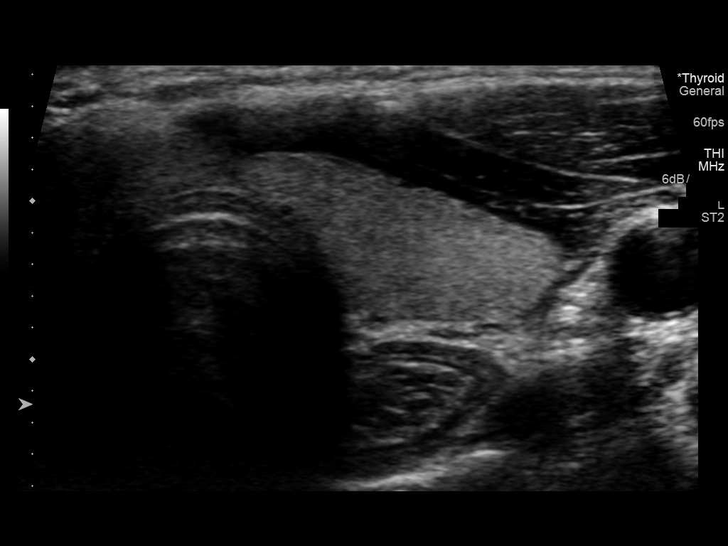
[im 33/44]
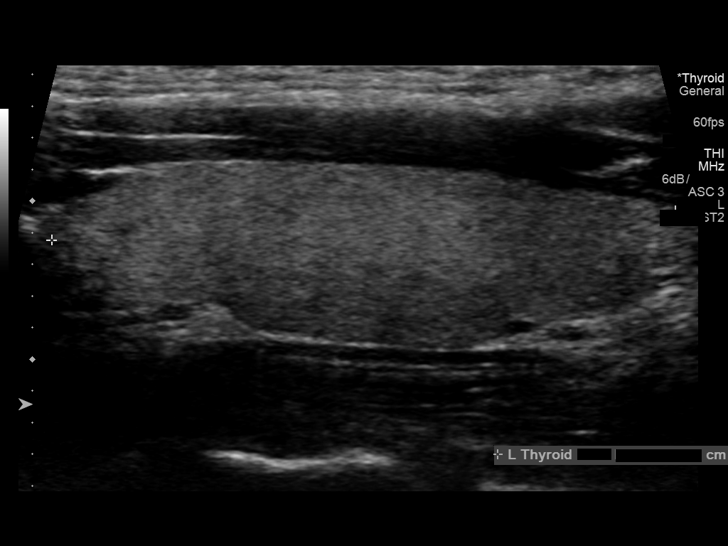
[im 36/44]
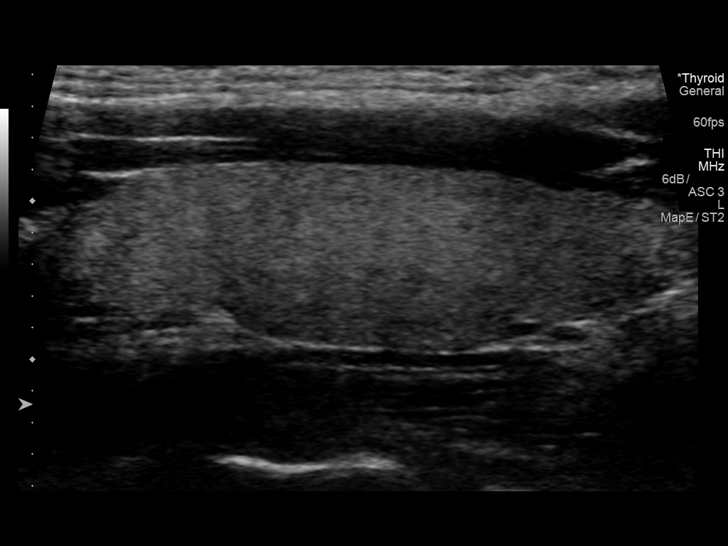
[im 40/44]
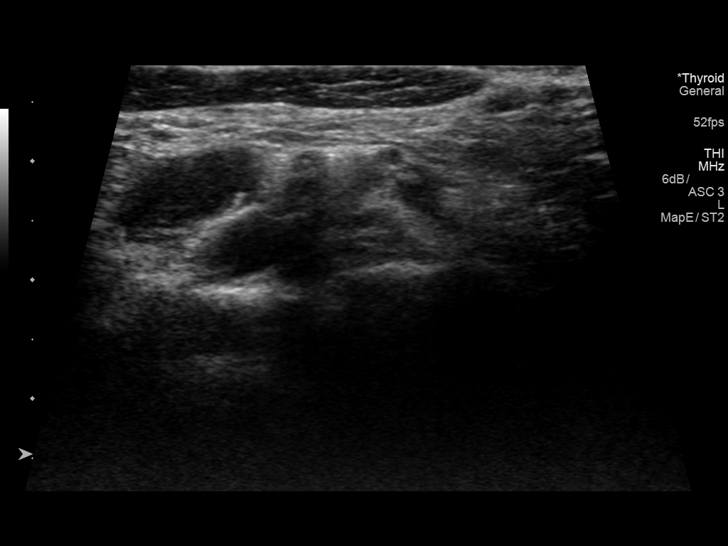
[im 44/44]
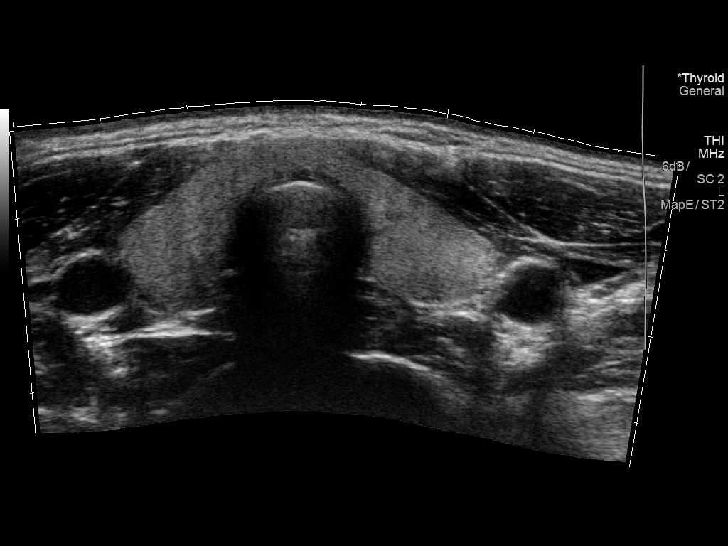

[14 of 25 positions shown; findings below may reference images not displayed]

FINDINGS: Parenchymal Echotexture: Mildly heterogenous

Isthmus: 0.5 cm thickness

Right lobe: 3.9 x 1.5 x 1.5 cm

Left lobe: 3.9 x 1.1 x 1.6 cm

_________________________________________________________

Estimated total number of nodules >/= 1 cm: 0

Number of spongiform nodules >/=  2 cm not described below (TR1): 0

Number of mixed cystic and solid nodules >/= 1.5 cm not described
below (TR2): 0

_________________________________________________________

No discrete nodules are seen within the thyroid gland.
IMPRESSION: Normal thyroid.

The above is in keeping with the ACR TI-RADS recommendations - [HOSPITAL] 1732;[DATE].

## 2018-08-28 ENCOUNTER — Encounter: Payer: Self-pay | Admitting: Internal Medicine

## 2018-09-05 NOTE — Progress Notes (Signed)
Subjective:    Patient ID: Cindy Bates, female    DOB: [birth year], 23 y.o.   MRN: [id number]  HPI The patient is here for an acute visit.   Dizziness:  It started about 10 days ago.  Mostly it feels like she is on a boat or has motion sickness.  If she gets up or turns or is driving she feels it.  Laying down or going to sleep helps.    She is taking claritin D.  She tried stopping it and it did not change.   She tried dramamine and it did not help.  Her symptoms have not improved her worsened since they started.  They do occur several times throughout the day.  She has had some associated nausea.  She denies vomiting.  She denies any numbness, tingling or weakness in her arms or legs.  She denies headaches.  She denies changes in vision.   Medications and allergies reviewed with patient and updated if appropriate.  Patient Active Problem List   Diagnosis Date Noted  . Anxiety 06/26/2018  . Sleep difficulties 06/26/2018  . Lightheadedness 06/21/2017  . Fatigue 06/21/2017  . 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 Genetic testing 03/09/2017  . 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 Asthma 02/09/2017  . Hip pain, acute, right 02/09/2017  . 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 Family history of BRCA1 gene positive   . Family history of breast cancer   . Family history of ovarian cancer     Current Outpatient Medications on File Prior to Visit  Medication Sig Dispense Refill  . loratadine-pseudoephedrine (CLARITIN-D 24 HOUR) 10-240 MG 24 hr tablet Take 1 tablet by mouth daily. 30 tablet 3  . medroxyPROGESTERone (DEPO-PROVERA) 150 MG/ML injection Inject 150 mg into the muscle every 3 (three) months.    . Multiple Vitamin (MULTIVITAMIN WITH MINERALS) TABS tablet Take 1 tablet by mouth 2 (two) times daily.    Marland Kitchen PARoxetine (PAXIL) 20 MG tablet TAKE 1 TABLET BY MOUTH EVERY DAY 90 tablet 2  . valACYclovir (VALTREX) 1000 MG tablet Take 1 tablet (1,000 mg total) by mouth 2 (two) times daily. 60 tablet 3   No  current facility-administered medications on file prior to visit.     Past Medical History:  Diagnosis Date  . Allergy   . Asthma   . Family history of BRCA1 gene positive   . Family history of breast cancer   . Family history of ovarian cancer     No past surgical history on file.  Social History   Socioeconomic History  . Marital status: Single    Spouse name: Not on file  . Number of children: Not on file  . Years of education: Not on file  . Highest education level: Not on file  Occupational History  . Not on file  Social Needs  . Financial resource strain: Not on file  . Food insecurity    Worry: Not on file    Inability: Not on file  . Transportation needs    Medical: Not on file    Non-medical: Not on file  Tobacco Use  . Smoking status: Never Smoker  . Smokeless tobacco: Never Used  Substance and Sexual Activity  . Alcohol use: No    Frequency: Never  . Drug use: No  . Sexual activity: Not on file  Lifestyle  . Physical activity    Days per week: Not on file    Minutes per session: Not on file  . Stress:  Not on file  Relationships  . Social Herbalist on phone: Not on file    Gets together: Not on file    Attends religious service: Not on file    Active member of club or organization: Not on file    Attends meetings of clubs or organizations: Not on file    Relationship status: Not on file  Other Topics Concern  . Not on file  Social History Narrative  . Not on file    Family History  Problem Relation Age of Onset  . Breast cancer Mother 47       recurred in 75's  . Hypertension Mother   . Alcohol abuse Father   . Hypertension Father   . Hypertension Maternal Grandmother   . Diabetes Maternal Grandmother   . Other Maternal Grandmother        hysterectomy in 40's  . Alcohol abuse Paternal Grandfather   . Ovarian cancer Other   . Breast cancer Other     Review of Systems  Constitutional: Negative for chills and fever.  HENT:  Negative for congestion, ear pain, sinus pain and sore throat.   Eyes: Negative for visual disturbance.  Respiratory: Negative for cough, shortness of breath and wheezing.   Cardiovascular: Negative for chest pain and palpitations.  Gastrointestinal: Positive for nausea. Negative for vomiting.  Neurological: Positive for dizziness and light-headedness. Negative for weakness, numbness and headaches.       Objective:   Vitals:   09/06/18 1408  BP: 112/76  Pulse: (!) 106  Resp: 14  Temp: 98 F (36.7 C)  SpO2: 98%   BP Readings from Last 3 Encounters:  09/06/18 112/76  07/26/18 134/84  06/26/18 108/72   Wt Readings from Last 3 Encounters:  09/06/18 122 lb (55.3 kg)  07/26/18 121 lb (54.9 kg)  06/26/18 127 lb (57.6 kg)   Body mass index is 21.61 kg/m.   Physical Exam Constitutional:      General: She is not in acute distress.    Appearance: Normal appearance. She is not ill-appearing.  HENT:     Head: Normocephalic and atraumatic.     Right Ear: Tympanic membrane, ear canal and external ear normal. There is no impacted cerumen.     Left Ear: Tympanic membrane, ear canal and external ear normal. There is no impacted cerumen.     Nose: Nose normal.     Mouth/Throat:     Mouth: Mucous membranes are moist.     Pharynx: No posterior oropharyngeal erythema.  Eyes:     Conjunctiva/sclera: Conjunctivae normal.  Neck:     Musculoskeletal: Neck supple. No muscular tenderness.  Cardiovascular:     Rate and Rhythm: Normal rate and regular rhythm.  Pulmonary:     Effort: Pulmonary effort is normal. No respiratory distress.     Breath sounds: No wheezing or rales.  Lymphadenopathy:     Cervical: No cervical adenopathy.  Skin:    General: Skin is warm and dry.  Neurological:     General: No focal deficit present.     Mental Status: She is alert.     Cranial Nerves: No cranial nerve deficit.     Sensory: No sensory deficit.     Motor: No weakness.     Gait: Gait normal.             Assessment & Plan:    See Problem List for Assessment and Plan of chronic medical problems.

## 2018-09-05 NOTE — Progress Notes (Deleted)
Subjective:    Patient ID: Cindy Bates, female    DOB: 1995/07/05, 23 y.o.   MRN: [id number]  HPI The patient is here for an acute visit.   Dizziness:    Medications and allergies reviewed with patient and updated if appropriate.  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 1 Inhaler 1   loratadine-pseudoephedrine (CLARITIN-D 24 HOUR) 10-240 MG 24 hr tablet Take 1 tablet by mouth daily. 30 tablet 3   medroxyPROGESTERone (DEPO-PROVERA) 150 MG/ML injection Inject 150 mg into the muscle every 3 (three) months.     Multiple Vitamin (MULTIVITAMIN WITH MINERALS) TABS tablet Take 1 tablet by mouth 2 (two) times daily.     PARoxetine (PAXIL) 20 MG tablet TAKE 1 TABLET BY MOUTH EVERY DAY 90 tablet 2   valACYclovir (VALTREX) 1000 MG tablet Take 1 tablet (1,000 mg total) by mouth 2 (two) times daily. 60 tablet 3   No current facility-administered medications on file prior to visit.     Past Medical History:  Diagnosis Date   Allergy    Asthma    Family history of BRCA1 gene positive    Family history of breast cancer    Family history of ovarian cancer     No past surgical history on file.  Social History   Socioeconomic History   Marital status: Single    Spouse name: Not on file   Number of children: Not on  file   Years of education: Not on file   Highest education level: Not on file  Occupational History   Not on file  Social Needs   Financial resource strain: Not on file   Food insecurity    Worry: Not on file    Inability: Not on file   Transportation needs    Medical: Not on file    Non-medical: Not on file  Tobacco Use   Smoking status: Never Smoker   Smokeless tobacco: Never Used  Substance and Sexual Activity   Alcohol use: No    Frequency: Never   Drug use: No   Sexual activity: Not on file  Lifestyle   Physical activity    Days per week: Not on file    Minutes per session: Not on file   Stress: Not on file  Relationships   Social connections    Talks on phone: Not on file    Gets together: Not on file    Attends religious service: Not on file    Active member of club or organization: Not on file    Attends meetings of clubs or organizations: Not on file    Relationship status: Not on file  Other Topics Concern   Not on file  Social History Narrative   Not on file    Family History  Problem Relation Age of Onset   Breast cancer Mother 18       recurred in 16's   Hypertension Mother    Alcohol abuse Father    Hypertension Father    Hypertension Maternal Grandmother    Diabetes Maternal Grandmother    Other Maternal Grandmother        hysterectomy in 41's   Alcohol abuse Paternal Grandfather    Ovarian cancer Other    Breast cancer Other     Review of Systems     Objective:  There were no vitals filed for this visit. BP Readings from Last 3 Encounters:  07/26/18 134/84  06/26/18 108/72  01/14/18 120/80   Wt Readings from Last 3 Encounters:  07/26/18 121 lb (54.9 kg)  06/26/18 127 lb (57.6 kg)  01/14/18 123 lb (55.8 kg)   There is no height or weight on file to calculate BMI.   Physical Exam         Assessment & Plan:    See Problem List for Assessment and Plan of chronic medical problems.

## 2018-09-06 ENCOUNTER — Ambulatory Visit (INDEPENDENT_AMBULATORY_CARE_PROVIDER_SITE_OTHER): Payer: 59 | Admitting: Internal Medicine

## 2018-09-06 ENCOUNTER — Ambulatory Visit: Payer: 59 | Admitting: Internal Medicine

## 2018-09-06 ENCOUNTER — Encounter: Payer: Self-pay | Admitting: Internal Medicine

## 2018-09-06 ENCOUNTER — Other Ambulatory Visit: Payer: Self-pay

## 2018-09-06 VITALS — BP 112/76 | HR 106 | Temp 98.0°F | Resp 14 | Ht 63.0 in | Wt 122.0 lb

## 2018-09-06 DIAGNOSIS — Z23 Encounter for immunization: Secondary | ICD-10-CM

## 2018-09-06 DIAGNOSIS — H8110 Benign paroxysmal vertigo, unspecified ear: Secondary | ICD-10-CM | POA: Diagnosis not present

## 2018-09-06 MED ORDER — DIAZEPAM 2 MG PO TABS: 2.0000 mg | ORAL_TABLET | Freq: Three times a day (TID) | ORAL | 0 refills | Status: DC | PRN

## 2018-09-06 MED ORDER — MECLIZINE HCL 12.5 MG PO TABS: 12.5000 mg | ORAL_TABLET | Freq: Three times a day (TID) | ORAL | 0 refills | Status: DC | PRN

## 2018-09-06 NOTE — Assessment & Plan Note (Addendum)
Symptoms consistent with BPPV No concerning neurological symptoms, exam nonfocal No relief with Claritin-D or Dramamine Meclizine prn or Valium prn - will give a small script of both to try Referred to PT Call if symptoms do not improve

## 2018-09-06 NOTE — Patient Instructions (Signed)
Your dizziness is likely BPPV.    Take the meclizine or valium as needed for the dizziness.     A referral was ordered for PT.     How to Perform the Epley Maneuver The Epley maneuver is an exercise that relieves symptoms of vertigo. Vertigo is the feeling that you or your surroundings are moving when they are not. When you feel vertigo, you may feel like the room is spinning and have trouble walking. Dizziness is a little different than vertigo. When you are dizzy, you may feel unsteady or light-headed. You can do this maneuver at home whenever you have symptoms of vertigo. You can do it up to 3 times a day until your symptoms go away. Even though the Epley maneuver may relieve your vertigo for a few weeks, it is possible that your symptoms will return. This maneuver relieves vertigo, but it does not relieve dizziness. What are the risks? If it is done correctly, the Epley maneuver is considered safe. Sometimes it can lead to dizziness or nausea that goes away after a short time. If you develop other symptoms, such as changes in vision, weakness, or numbness, stop doing the maneuver and call your health care provider. How to perform the Epley maneuver 1. Sit on the edge of a bed or table with your back straight and your legs extended or hanging over the edge of the bed or table. 2. Turn your head halfway toward the affected ear or side. 3. Lie backward quickly with your head turned until you are lying flat on your back. You may want to position a pillow under your shoulders. 4. Hold this position for 30 seconds. You may experience an attack of vertigo. This is normal. 5. Turn your head to the opposite direction until your unaffected ear is facing the floor. 6. Hold this position for 30 seconds. You may experience an attack of vertigo. This is normal. Hold this position until the vertigo stops. 7. Turn your whole body to the same side as your head. Hold for another 30 seconds. 8. Sit back  up. You can repeat this exercise up to 3 times a day. Follow these instructions at home:  After doing the Epley maneuver, you can return to your normal activities.  Ask your health care provider if there is anything you should do at home to prevent vertigo. He or she may recommend that you: ? Keep your head raised (elevated) with two or more pillows while you sleep. ? Do not sleep on the side of your affected ear. ? Get up slowly from bed. ? Avoid sudden movements during the day. ? Avoid extreme head movement, like looking up or bending over. Contact a health care provider if:  Your vertigo gets worse.  You have other symptoms, including: ? Nausea. ? Vomiting. ? Headache. Get help right away if:  You have vision changes.  You have a severe or worsening headache or neck pain.  You cannot stop vomiting.  You have new numbness or weakness in any part of your body. Summary  Vertigo is the feeling that you or your surroundings are moving when they are not.  The Epley maneuver is an exercise that relieves symptoms of vertigo.  If the Epley maneuver is done correctly, it is considered safe. You can do it up to 3 times a day. This information is not intended to replace advice given to you by your health care provider. Make sure you discuss any questions you have with  your health care provider. Document Released: 01/07/2013 Document Revised: 12/15/2016 Document Reviewed: 11/23/2015 Elsevier Patient Education  2020 [employer]

## 2018-09-11 ENCOUNTER — Other Ambulatory Visit: Payer: Self-pay

## 2018-09-11 ENCOUNTER — Ambulatory Visit: Payer: 59 | Attending: Internal Medicine | Admitting: Rehabilitative and Restorative Service Providers"

## 2018-09-11 ENCOUNTER — Encounter: Payer: Self-pay | Admitting: Rehabilitative and Restorative Service Providers"

## 2018-09-11 DIAGNOSIS — R42 Dizziness and giddiness: Secondary | ICD-10-CM | POA: Insufficient documentation

## 2018-09-11 NOTE — Therapy (Signed)
[facility] [address] , [place], [address] Phone: [phone]   Fax:  [phone]  Physical Therapy Evaluation  Patient Details  Name: Cindy Bates MRN: [id number] Date of Birth: 11/02/1995 Referring Provider (PT): Stacy burns, MD   Encounter Date: 09/11/2018  PT End of Session - 09/11/18 1206    Visit Number  1    Number of Visits  4    Date for PT Re-Evaluation  10/11/18    Authorization Type  aetna    PT Start Time  0722    PT Stop Time  0800    PT Time Calculation (min)  38 min       Past Medical History:  Diagnosis Date  . Allergy   . Asthma   . Family history of BRCA1 gene positive   . Family history of breast cancer   . Family history of ovarian cancer     History reviewed. No pertinent surgical history.  There were no vitals filed for this visit.   Subjective Assessment - 09/11/18 0726    Subjective  The patient reports sudden onset of dizziness August 12 during the day.  She notes iniital dizziness was constant.  She is taking meclizine and that is helping.  She denies prior h/o migraines, dizziness.  She describes current symptoms as motion sickness like she is rocking on a boat.    Pertinent History  reviewed in epic    Patient Stated Goals  learn some exercises to help; "find out what is wrong"    Currently in Pain?  No/denies         [facility] PT Assessment - 09/11/18 0730      Assessment   Medical Diagnosis  BPPV    Referring Provider (PT)  Stacy burns, MD    Onset Date/Surgical Date  08/28/18    Prior Therapy  none      Precautions   Precautions  None      Restrictions   Weight Bearing Restrictions  No      Balance Screen   Has the patient fallen in the past 6 months  No    Has the patient had a decrease in activity level because of a fear of falling?   No    Is the patient reluctant to leave their home because of a fear of falling?   No      Home Environment   Living  Environment  Private residence    Additional Comments  Independent with walking       Prior Function   Level of Independence  Independent           Vestibular Assessment - 09/11/18 0730      Vestibular Assessment   General Observation  Patient walks independently into clinic.        Symptom Behavior   Subjective history of current problem  Began 08/28/2018    Type of Dizziness   --   rocking, motion sickness type feeling   Frequency of Dizziness  Nothing when she takes the meclizine.    Duration of Dizziness  was constant to begin    Symptom Nature  Spontaneous    Aggravating Factors  Sit to stand;Turning head sideways;Turning head quickly   worse later in the day/ stayed constant   Relieving Factors  Lying supine    Progression of Symptoms  Better    History of similar episodes  no prior history      Oculomotor   Exam   Oculomotor Alignment  Normal    Ocular ROM  WFLs    Spontaneous  Absent    Gaze-induced   Absent    Smooth Pursuits  Intact    Saccades  Intact      Vestibulo-Ocular Reflex   VOR 1 Head Only (x 1 viewing)  VOR x 1 x 30 seconds provokes a 2/10 and nausea of 4/10.    VOR Cancellation  Normal   dizziness 2/10, nausea 0/10   Comment  Head impulse test=no evidence of correction to target or refixation saccade.  Symptoms provoked of 6/10.        Visual Acuity   Static  line 8    Dynamic  line 4      Positional Testing   Dix-Hallpike  Dix-Hallpike Right;Dix-Hallpike Left    Horizontal Canal Testing  Horizontal Canal Right;Horizontal Canal Left      Dix-Hallpike Right   Dix-Hallpike Right Duration  0    Dix-Hallpike Right Symptoms  No nystagmus      Dix-Hallpike Left   Dix-Hallpike Left Duration  0    Dix-Hallpike Left Symptoms  No nystagmus      Horizontal Canal Right   Horizontal Canal Right Duration  0    Horizontal Canal Right Symptoms  Normal      Horizontal Canal Left   Horizontal Canal Left Duration  0    Horizontal Canal Left Symptoms   Normal      Positional Sensitivities   Supine to Sitting  No dizziness    Right Hallpike  No dizziness    Up from Right Hallpike  No dizziness    Up from Left Hallpike  No dizziness    Head Turning x 5  No dizziness    Head Nodding x 5  No dizziness    Pivot Right in Standing  No dizziness    Pivot Left in Standing  No dizziness    Rolling Right  No dizziness    Rolling Left  No dizziness    Positional Sensitivities Comments  Patient notes dizziness improved since onset, meclizine has helped.  Is unsure of movements that will provoke it.          Objective measurements completed on examination: See above findings.       Vestibular Treatment/Exercise - 09/11/18 0750      Vestibular Treatment/Exercise   Vestibular Treatment Provided  Gaze    Gaze Exercises  X1 Viewing Horizontal;X1 Viewing Vertical      X1 Viewing Horizontal   Foot Position  standing feet apart    Comments  30 seconds with reports of visual blurring      X1 Viewing Vertical   Foot Position  standing feet apart    Comments  30 sevconds provided for HEP            PT Education - 09/11/18 1206    Education Details  gaze adaptation    Person(s) Educated  Patient    Methods  Explanation;Demonstration;Handout    Comprehension  Verbalized understanding;Returned demonstration          PT Long Term Goals - 09/11/18 0829      PT LONG TERM GOAL #1   Title  The patient will tolerate x 1 viewing for HEP x 1 minute withut c/o visual blurring or dizziness/nausea to demo improved tolerance to head motion.    Time  6    Period  Weeks    Target Date  10/26/18  PT LONG TERM GOAL #2   Title  The patient will report tolerating walking program and household chores without reports of dizziness.    Time  6    Period  Weeks    Target Date  10/26/18             Plan - 09/11/18 0830    Clinical Impression Statement  The patient is a 23 yo female presenting with onset of vertigo 08/28/2018 that  occurred constantly at onset and is improved with use of meclizine.  During today's evaluation, she had difficulty with VOR per 4 line difference on static versus dynamic visual acuity and visual blurring during VOR. Positional testing was negative at this time.  Patient does report h/o multiple concussions and also uses lenses intermittently.  PT provided HEP to address deficits.    Stability/Clinical Decision Making  Stable/Uncomplicated    Clinical Decision Making  Low    Rehab Potential  Good    PT Frequency  1x / week    PT Duration  4 weeks    PT Treatment/Interventions  Vestibular;Canalith Repostioning    PT Next Visit Plan  Check gaze HEP, does patinet still need meclizine to get through day?  Add further habituation as needed.    Consulted and Agree with Plan of Care  Patient       Patient will benefit from skilled therapeutic intervention in order to improve the following deficits and impairments:  Dizziness  Visit Diagnosis: Dizziness and giddiness     Problem List Patient Active Problem List   Diagnosis Date Noted  . BPPV (benign paroxysmal positional vertigo) 09/06/2018  . Anxiety 06/26/2018  . Sleep difficulties 06/26/2018  . Lightheadedness 06/21/2017  . Fatigue 06/21/2017  . 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 Genetic testing 03/09/2017  . 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 Asthma 02/09/2017  . Hip pain, acute, right 02/09/2017  . 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 Family history of BRCA1 gene positive   . Family history of breast cancer   . Family history of ovarian cancer     WEAVER,CHRISTINA, PT 09/11/2018, 12:24 PM  [facility] [address] [place], [place], [address] Phone: [phone]   Fax:  [phone]  Name: Cindy Bates MRN: [id number] Date of Birth: 07/04/1995  

## 2018-09-11 NOTE — Patient Instructions (Signed)
Access Code: [email or web address]  URL: [email or web address]  Date: 09/11/2018  Prepared by: Rudell Cobb   Program Notes  Begin at 30 seconds and work up to a minute as you can tolerate. End goal will be 2 minutes nonstop of side to side and up and down head motion x 3 times per day.   Exercises Standing Gaze Stabilization with Head Rotation - 1 reps - 2 sets - 3x daily - 7x weekly Standing Gaze Stabilization with Head Nod - 1 reps - 2 sets - 3x daily - 7x weekly

## 2018-09-18 DIAGNOSIS — Z3042 Encounter for surveillance of injectable contraceptive: Secondary | ICD-10-CM | POA: Diagnosis not present

## 2018-10-02 ENCOUNTER — Encounter: Payer: Self-pay | Admitting: Rehabilitative and Restorative Service Providers"

## 2018-10-02 ENCOUNTER — Ambulatory Visit: Payer: 59 | Attending: Internal Medicine | Admitting: Rehabilitative and Restorative Service Providers"

## 2018-10-02 ENCOUNTER — Other Ambulatory Visit: Payer: Self-pay

## 2018-10-02 DIAGNOSIS — R42 Dizziness and giddiness: Secondary | ICD-10-CM | POA: Diagnosis not present

## 2018-10-02 NOTE — Therapy (Signed)
[facility] [address] [place], [place], [address] Phone: [phone]   Fax:  [phone]  Physical Therapy Treatment and discharge summary  Patient Details  Name: Cindy Bates MRN: [id number] Date of Birth: 1995/06/26 Referring Provider (PT): Billey Gosling, MD   Encounter Date: 10/02/2018  PT End of Session - 10/02/18 0720    Visit Number  2    Number of Visits  4    Date for PT Re-Evaluation  10/11/18    Authorization Type  aetna    PT Start Time  3893    PT Stop Time  0726    PT Time Calculation (min)  9 min       Past Medical History:  Diagnosis Date  . Allergy   . Asthma   . Family history of BRCA1 gene positive   . Family history of breast cancer   . Family history of ovarian cancer     History reviewed. No pertinent surgical history.  There were no vitals filed for this visit.  Subjective Assessment - 10/02/18 0719    Subjective  The patient reports vertigo has resolved completely.  She reports exercises initially provoked nausea, but after 2 weeks it improved.  She has stopped taping meclizine and is back to baseline.    Pertinent History  reviewed in epic    Patient Stated Goals  learn some exercises to help; "find out what is wrong"                       [facility] Adult PT Treatment/Exercise - 10/02/18 0726      Self-Care   Self-Care  Other Self-Care Comments    Other Self-Care Comments   patient notes neck tightness; PT discussed cervical muscle guarding after vertigo and recommended general ROM for neck musculature      Vestibular Treatment/Exercise - 10/02/18 0725      Vestibular Treatment/Exercise   Vestibular Treatment Provided  Gaze      X1 Viewing Horizontal   Foot Position  standing    Comments  60 seconds nonstop with cues on increasing amplitude of movement; no symptoms                  PT Long Term Goals - 10/02/18 7342      PT LONG TERM GOAL #1   Title   The patient will tolerate x 1 viewing for HEP x 1 minute withut c/o visual blurring or dizziness/nausea to demo improved tolerance to head motion.    Time  6    Period  Weeks    Status  Achieved      PT LONG TERM GOAL #2   Title  The patient will report tolerating walking program and household chores without reports of dizziness.    Time  6    Period  Weeks    Status  Achieved            Plan - 10/02/18 0723    Clinical Impression Statement  The patient has met LTGs and reports no further episodes of vertigo.    Stability/Clinical Decision Making  Stable/Uncomplicated    Rehab Potential  Good    PT Frequency  1x / week    PT Duration  4 weeks    PT Treatment/Interventions  Vestibular;Canalith Repostioning    PT Next Visit Plan  d/c today.    Consulted and Agree with Plan of Care  Patient  Patient will benefit from skilled therapeutic intervention in order to improve the following deficits and impairments:  Dizziness  Visit Diagnosis: Dizziness and giddiness    PHYSICAL THERAPY DISCHARGE SUMMARY  Visits from Start of Care: 2  Current functional level related to goals / functional outcomes: See above   Remaining deficits: none   Education / Equipment:  Neck stretches to reduce muscle guarding   Plan: Patient agrees to discharge.  Patient goals were met. Patient is being discharged due to meeting the stated rehab goals.  ?????     Thank you for the referral of this patient. Rudell Cobb, MPT   [place], PT 10/02/2018, 7:28 AM  [facility] [address] [place], [place], [address] Phone: [phone]   Fax:  [phone]  Name: Cindy Bates MRN: [id number] Date of Birth: [birth year]

## 2018-12-05 ENCOUNTER — Ambulatory Visit (INDEPENDENT_AMBULATORY_CARE_PROVIDER_SITE_OTHER): Payer: 59 | Admitting: Internal Medicine

## 2018-12-05 ENCOUNTER — Encounter: Payer: Self-pay | Admitting: Internal Medicine

## 2018-12-05 ENCOUNTER — Other Ambulatory Visit: Payer: Self-pay

## 2018-12-05 DIAGNOSIS — Z20828 Contact with and (suspected) exposure to other viral communicable diseases: Secondary | ICD-10-CM

## 2018-12-05 DIAGNOSIS — Z20822 Contact with and (suspected) exposure to covid-19: Secondary | ICD-10-CM | POA: Insufficient documentation

## 2018-12-05 MED ORDER — PREDNISONE 20 MG PO TABS: 40.0000 mg | ORAL_TABLET | Freq: Every day | ORAL | 0 refills | Status: DC

## 2018-12-05 MED ORDER — ALBUTEROL SULFATE HFA 108 (90 BASE) MCG/ACT IN AERS: 2.0000 | INHALATION_SPRAY | Freq: Four times a day (QID) | RESPIRATORY_TRACT | 0 refills | Status: DC | PRN

## 2018-12-05 NOTE — Assessment & Plan Note (Signed)
Checking covid-19 testing. Advised to quarantine. Discussed course. Could be asthma flare so rx prednisone and albuterol inhaler. Advised on return precautions as well as need to seek care in ER for SOB worsening.

## 2018-12-05 NOTE — Progress Notes (Signed)
Virtual Visit via Video Note  I connected with Cindy Bates on 12/05/18 at  2:40 PM EST by a video enabled telemedicine application and verified that I am speaking with the correct person using two identifiers.  The patient and the provider were at separate locations throughout the entire encounter.   I discussed the limitations of evaluation and management by telemedicine and the availability of in person appointments. The patient expressed understanding and agreed to proceed. The patient and the provider were the only parties present for the visit unless noted in HPI below.  History of Present Illness: The patient is a 23 y.o. female with visit for SOB and cough and sore throat. Started Sunday with sore throat. She took cough lozenges and this did not help. The next day she was feeling more cold symptoms. Went to pharmacy and got some cold medicine which she did take. This has not helped much. Denies much cough. Some muscle aches. Temp 99 which is higher than normal for her. She does have SOB mostly with exertion but sometimes just with sitting still. Has very old albuterol inhaler which is not helping much. Overall it is not improving. Has tried albuterol and cold medicines  Observations/Objective: Appearance: normal, breathing appears normal, no coughing during visit, casual grooming, abdomen does not appear distended, throat with drainage, memory normal, mental status is A and O times 3  Assessment and Plan: See problem oriented charting  Follow Up Instructions: covid-19 testing, rx prednisone and albuterol inhaler  Visit time 25 minutes: greater than 50% of that time was spent in face to face counseling and coordination of care with the patient: counseled about need to quarantine until results return, course expected with negative and positive covid-19 results.   I discussed the assessment and treatment plan with the patient. The patient was provided an opportunity to ask questions and all were  answered. The patient agreed with the plan and demonstrated an understanding of the instructions.   The patient was advised to call back or seek an in-person evaluation if the symptoms worsen or if the condition fails to improve as anticipated.  Hoyt Koch, MD

## 2018-12-06 ENCOUNTER — Other Ambulatory Visit: Payer: Self-pay

## 2018-12-06 DIAGNOSIS — Z20822 Contact with and (suspected) exposure to covid-19: Secondary | ICD-10-CM

## 2018-12-09 LAB — NOVEL CORONAVIRUS, NAA: SARS-CoV-2, NAA: NOT DETECTED

## 2018-12-31 ENCOUNTER — Other Ambulatory Visit: Payer: Self-pay | Admitting: Internal Medicine

## 2019-01-28 ENCOUNTER — Ambulatory Visit: Payer: No Typology Code available for payment source | Admitting: Internal Medicine

## 2019-04-03 ENCOUNTER — Ambulatory Visit: Payer: No Typology Code available for payment source | Attending: Family

## 2019-04-03 DIAGNOSIS — Z23 Encounter for immunization: Secondary | ICD-10-CM

## 2019-04-03 NOTE — Progress Notes (Signed)
   Covid-19 Vaccination Clinic  Name:  Cindy Bates    MRN: [id number] DOB: 05/20/1995  04/03/2019  [name] was observed post Covid-19 immunization for 30 minutes based on pre-vaccination screening without incident. She was provided with Vaccine Information Sheet and instruction to access the V-Safe system.   [name] was instructed to call 911 with any severe reactions post vaccine: Marland Kitchen Difficulty breathing  . Swelling of face and throat  . A fast heartbeat  . A bad rash all over body  . Dizziness and weakness   Immunizations Administered    Name Date Dose VIS Date Route   Moderna COVID-19 Vaccine 04/03/2019 12:06 PM 0.5 mL 12/17/2018 Intramuscular   Manufacturer: Moderna   Lot: [id number]   NDC: 46219-471-25

## 2019-04-27 IMAGING — CR DG CHEST 2V
2 series · 2 of 2 positions shown · non-contrast
Comparison: Chest radiograph performed 02/17/2013

CLINICAL DATA: Acute onset of shortness of breath and throat
swelling.

EXAM:
CHEST - 2 VIEW

[w chest pa]
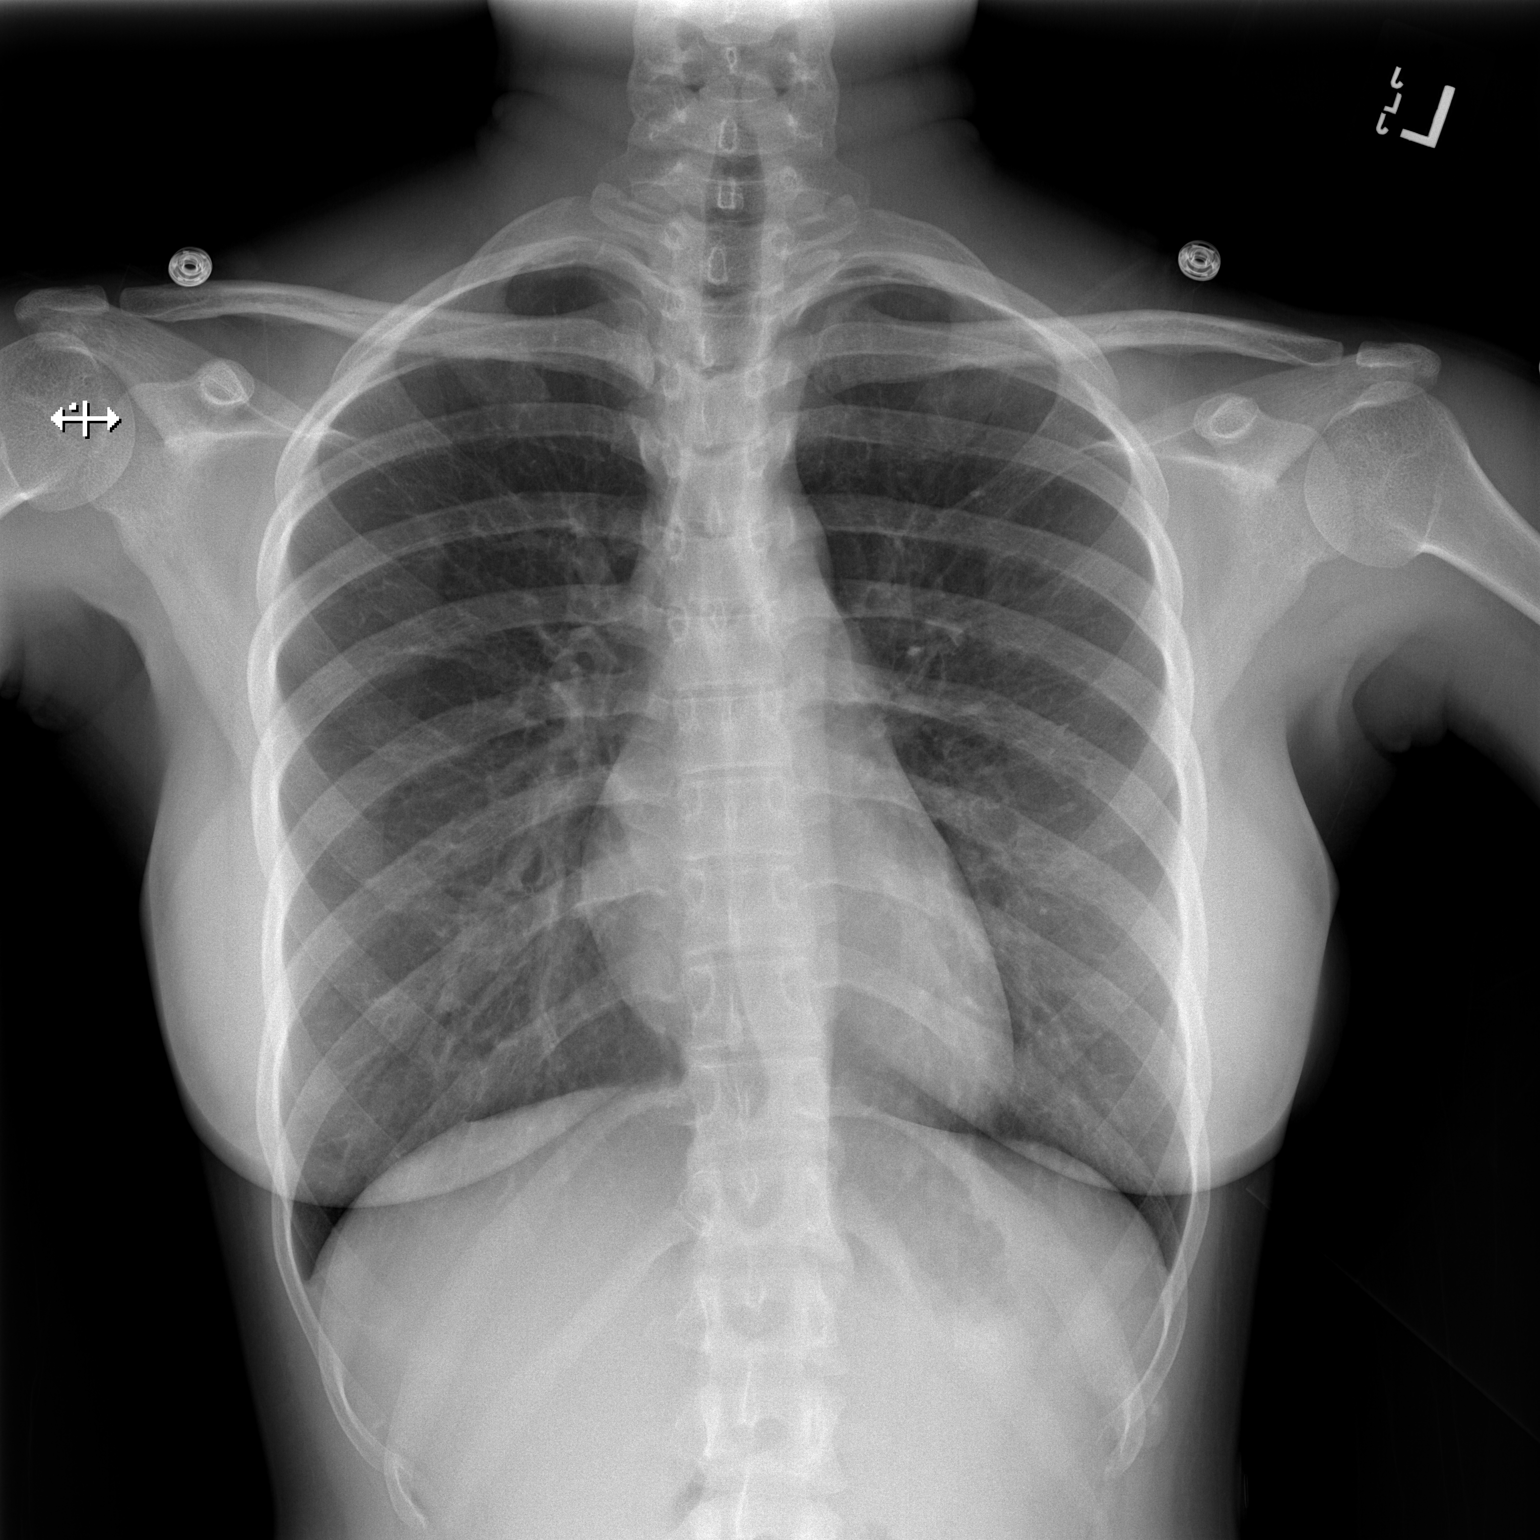

[w chest lat]
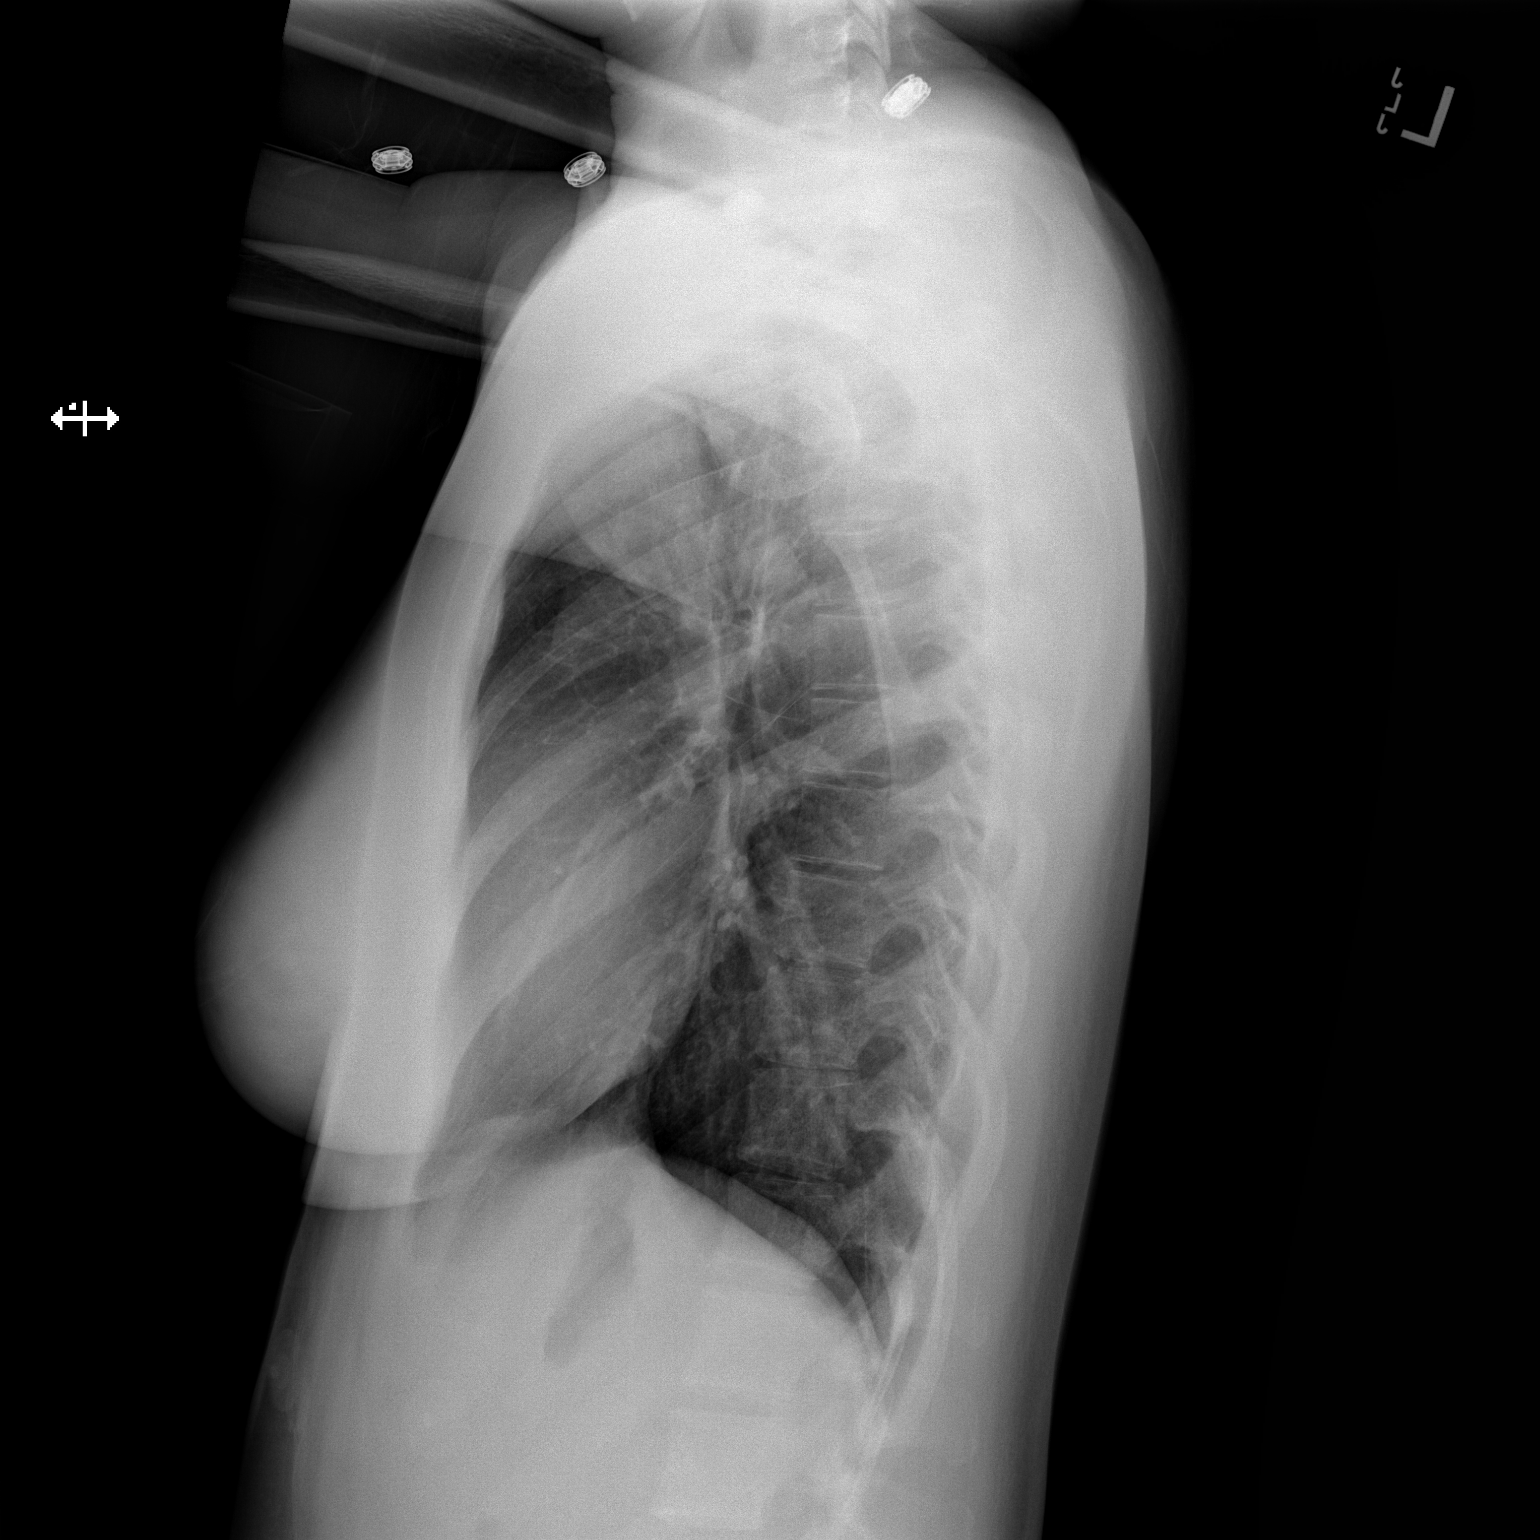

[2 of 2 positions shown; findings below may reference images not displayed]

FINDINGS: The lungs are well-aerated and clear. There is no evidence of focal
opacification, pleural effusion or pneumothorax.

The heart is normal in size; the mediastinal contour is within
normal limits. No acute osseous abnormalities are seen.
IMPRESSION: No acute cardiopulmonary process seen.

## 2019-05-06 ENCOUNTER — Ambulatory Visit: Payer: 59 | Attending: Family

## 2019-05-06 DIAGNOSIS — Z23 Encounter for immunization: Secondary | ICD-10-CM

## 2019-05-06 NOTE — Progress Notes (Signed)
   Covid-19 Vaccination Clinic  Name:  Cindy Bates    MRN: [id number] DOB: [birth year]  05/06/2019  Cindy Bates was observed post Covid-19 immunization for 15 minutes without incident. She was provided with Vaccine Information Sheet and instruction to access the V-Safe system.   Cindy Bates was instructed to call 911 with any severe reactions post vaccine: Marland Kitchen Difficulty breathing  . Swelling of face and throat  . A fast heartbeat  . A bad rash all over body  . Dizziness and weakness   Immunizations Administered    Name Date Dose VIS Date Route   Moderna COVID-19 Vaccine 05/06/2019 12:08 PM 0.5 mL 12/2018 Intramuscular   Manufacturer: Moderna   Lot: [id number]   NDC: 96295-284-13

## 2019-05-08 DIAGNOSIS — Z01411 Encounter for gynecological examination (general) (routine) with abnormal findings: Secondary | ICD-10-CM | POA: Diagnosis not present

## 2019-05-08 DIAGNOSIS — Z01419 Encounter for gynecological examination (general) (routine) without abnormal findings: Secondary | ICD-10-CM | POA: Diagnosis not present

## 2019-05-08 DIAGNOSIS — Z6824 Body mass index (BMI) 24.0-24.9, adult: Secondary | ICD-10-CM | POA: Diagnosis not present

## 2019-05-08 DIAGNOSIS — Z113 Encounter for screening for infections with a predominantly sexual mode of transmission: Secondary | ICD-10-CM | POA: Diagnosis not present

## 2019-05-08 DIAGNOSIS — R590 Localized enlarged lymph nodes: Secondary | ICD-10-CM | POA: Diagnosis not present

## 2019-05-08 DIAGNOSIS — Z803 Family history of malignant neoplasm of breast: Secondary | ICD-10-CM | POA: Diagnosis not present

## 2019-05-08 DIAGNOSIS — R69 Illness, unspecified: Secondary | ICD-10-CM | POA: Diagnosis not present

## 2019-05-08 DIAGNOSIS — L739 Follicular disorder, unspecified: Secondary | ICD-10-CM | POA: Diagnosis not present

## 2019-05-12 DIAGNOSIS — L669 Cicatricial alopecia, unspecified: Secondary | ICD-10-CM | POA: Diagnosis not present

## 2019-05-12 DIAGNOSIS — L658 Other specified nonscarring hair loss: Secondary | ICD-10-CM | POA: Diagnosis not present

## 2019-05-12 DIAGNOSIS — R208 Other disturbances of skin sensation: Secondary | ICD-10-CM | POA: Diagnosis not present

## 2019-05-15 ENCOUNTER — Other Ambulatory Visit: Payer: Self-pay | Admitting: Internal Medicine

## 2019-05-30 ENCOUNTER — Encounter: Payer: Self-pay | Admitting: Internal Medicine

## 2019-05-30 DIAGNOSIS — J309 Allergic rhinitis, unspecified: Secondary | ICD-10-CM

## 2019-06-16 ENCOUNTER — Encounter: Payer: Self-pay | Admitting: Internal Medicine

## 2019-06-17 ENCOUNTER — Other Ambulatory Visit: Payer: Self-pay

## 2019-06-17 DIAGNOSIS — R590 Localized enlarged lymph nodes: Secondary | ICD-10-CM | POA: Diagnosis not present

## 2019-06-17 MED ORDER — VALACYCLOVIR HCL 1 G PO TABS: 1000.0000 mg | ORAL_TABLET | Freq: Two times a day (BID) | ORAL | 3 refills | Status: DC

## 2019-07-01 ENCOUNTER — Encounter: Payer: Self-pay | Admitting: Internal Medicine

## 2019-07-03 ENCOUNTER — Ambulatory Visit (INDEPENDENT_AMBULATORY_CARE_PROVIDER_SITE_OTHER): Payer: No Typology Code available for payment source | Admitting: Internal Medicine

## 2019-07-03 ENCOUNTER — Encounter: Payer: Self-pay | Admitting: Internal Medicine

## 2019-07-03 ENCOUNTER — Other Ambulatory Visit: Payer: Self-pay

## 2019-07-03 DIAGNOSIS — J329 Chronic sinusitis, unspecified: Secondary | ICD-10-CM | POA: Insufficient documentation

## 2019-07-03 DIAGNOSIS — J0110 Acute frontal sinusitis, unspecified: Secondary | ICD-10-CM

## 2019-07-03 DIAGNOSIS — J309 Allergic rhinitis, unspecified: Secondary | ICD-10-CM | POA: Insufficient documentation

## 2019-07-03 MED ORDER — AMOXICILLIN-POT CLAVULANATE 875-125 MG PO TABS: 1.0000 | ORAL_TABLET | Freq: Two times a day (BID) | ORAL | 0 refills | Status: DC

## 2019-07-03 NOTE — Progress Notes (Signed)
   Subjective:   Patient ID: Cindy Bates, female    DOB: October 07, 1995, 24 y.o.   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 She has been taking valtrex for what she thinks is a flare of this for about 2 weeks. Her symptoms include cheek and neck swelling right sided. This started about 2 weeks ago and is stable since that time. Denies new foods or skin products. Denies fevers or chills. Does have chronic allergies and has those recently. Denies worse than usual. Feels swelling is under the skin and more in the mouth. Denies any known tooth problems or cavities. Denies fevers or chills. Rare feeling of sore throat with swallowing. Denies cough or SOB. Denies ear pain but sometimes get pressure in the ears.   Review of Systems  Constitutional: Negative.   HENT: Positive for congestion, ear pain, sinus pressure and sore throat.   Eyes: Negative.   Respiratory: Negative for cough, chest tightness and shortness of breath.   Cardiovascular: Negative for chest pain, palpitations and leg swelling.  Gastrointestinal: Negative for abdominal distention, abdominal pain, constipation, diarrhea, nausea and vomiting.  Musculoskeletal: Negative.   Skin: Negative.   Neurological: Negative.   Psychiatric/Behavioral: Negative.     Objective:  Physical Exam Constitutional:      Appearance: She is well-developed.  HENT:     Head: Normocephalic and atraumatic.     Mouth/Throat:     Pharynx: Posterior oropharyngeal erythema present.     Comments: On exam face does not appear externally swollen and no prominent LN in cervical region. She does have redness in the oropharynx without obstruction or swelling. Some tenderness to pressure on the frontal sinuses.  Cardiovascular:     Rate and Rhythm: Normal rate and regular rhythm.  Pulmonary:     Effort: Pulmonary effort is normal. No respiratory distress.     Breath sounds: Normal breath sounds. No wheezing or rales.    Abdominal:     General: Bowel sounds are normal. There is no distension.     Palpations: Abdomen is soft.     Tenderness: There is no abdominal tenderness. There is no rebound.  Musculoskeletal:     Cervical back: Normal range of motion.  Skin:    General: Skin is warm and dry.  Neurological:     Mental Status: She is alert and oriented to person, place, and time.     Coordination: Coordination normal.     Vitals:   07/03/19 0845  BP: 120/80  Pulse: 100  Temp: 99.2 F (37.3 C)  TempSrc: Oral  SpO2: 99%  Weight: 131 lb (59.4 kg)  Height: 5\' 3"  (1.6 m)    This visit occurred during the SARS-CoV-2 public health emergency.  Safety protocols were in place, including screening questions prior to the visit, additional usage of staff PPE, and extensive cleaning of exam room while observing appropriate contact time as indicated for disinfecting solutions.   Assessment & Plan:

## 2019-07-03 NOTE — Assessment & Plan Note (Addendum)
Does not appear to be HSV on exam and counseled about what this looks like. Treat for sinus infection with augmentin 1 week course. Continue claritin. If no improvement return. No signs of allergic reaction on exam so no prednisone warranted.

## 2019-07-03 NOTE — Patient Instructions (Signed)
We have sent in augmentin to take 1 pill twice a day for 1 week to clear up the sinuses.

## 2019-07-22 ENCOUNTER — Encounter: Payer: Self-pay | Admitting: Internal Medicine

## 2019-07-24 ENCOUNTER — Ambulatory Visit (INDEPENDENT_AMBULATORY_CARE_PROVIDER_SITE_OTHER): Payer: No Typology Code available for payment source | Admitting: Allergy

## 2019-07-24 ENCOUNTER — Encounter: Payer: Self-pay | Admitting: Allergy

## 2019-07-24 ENCOUNTER — Other Ambulatory Visit: Payer: Self-pay

## 2019-07-24 VITALS — BP 120/86 | HR 112 | Temp 97.6°F | Resp 12 | Ht 62.5 in | Wt 134.0 lb

## 2019-07-24 DIAGNOSIS — T781XXA Other adverse food reactions, not elsewhere classified, initial encounter: Secondary | ICD-10-CM

## 2019-07-24 DIAGNOSIS — J3089 Other allergic rhinitis: Secondary | ICD-10-CM | POA: Diagnosis not present

## 2019-07-24 DIAGNOSIS — T7800XA Anaphylactic reaction due to unspecified food, initial encounter: Secondary | ICD-10-CM | POA: Diagnosis not present

## 2019-07-24 DIAGNOSIS — J4520 Mild intermittent asthma, uncomplicated: Secondary | ICD-10-CM

## 2019-07-24 DIAGNOSIS — T7819XA Other adverse food reactions, not elsewhere classified, initial encounter: Secondary | ICD-10-CM

## 2019-07-24 DIAGNOSIS — H1013 Acute atopic conjunctivitis, bilateral: Secondary | ICD-10-CM

## 2019-07-24 MED ORDER — AZELASTINE-FLUTICASONE 137-50 MCG/ACT NA SUSP: 1.0000 | Freq: Two times a day (BID) | NASAL | 5 refills | Status: DC | PRN

## 2019-07-24 MED ORDER — EPINEPHRINE 0.3 MG/0.3ML IJ SOAJ: 0.3000 mg | Freq: Once | INTRAMUSCULAR | 1 refills | Status: AC

## 2019-07-24 NOTE — Progress Notes (Signed)
Subjective:    Patient ID: Cindy Bates, female    DOB: October 20, 1995, 24 y.o.   MRN: [id number]  HPI The patient is here for an acute visit.  She saw Dr Sharlet Salina on 6/17 for cheek and neck swelling on right side.  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  It had started two weeks prior.  No new foods or products.  She felt swelling in the skin and more in the mouth. She had some allergy/cold symptoms. She was treated for a sinus infection with augmentin.  When taking the medication it did help. Once she finished it cam back.   She states her cheek feeling swollen had resolved.  The inside of her lip is little bit swollen. The swelling in her lip has gotten larger and it feels itchy/irritated.  It is irritating with eating salty foods-the area feels salty near the corner of her right mouth.  She states the swelling was back by the angle of her jaw in the past.  There is still a little tenderness there.  She denies any pain with eating.  She has her typical allergy symptoms, but nothing beyond that.  She denies any fevers or chills..     She saw the allergist yesterday.  She was advised to take xyzal, epi pen and dymista.  She has significant allergies and she does want her to see an allergist.         Medications and allergies reviewed with patient and updated if appropriate.  Patient Active Problem List   Diagnosis Date Noted  . Sinusitis 07/03/2019  . Suspected COVID-19 virus infection 12/05/2018  . BPPV (benign paroxysmal positional vertigo) 09/06/2018  . Anxiety 06/26/2018  . Sleep difficulties 06/26/2018  . Fatigue 06/21/2017  . 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 Genetic testing 03/09/2017  . 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 Asthma 02/09/2017  . 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 Family history of BRCA1 gene positive   . Family history of breast cancer   . Family history of ovarian cancer     Current Outpatient Medications on File Prior to Visit  Medication Sig Dispense Refill  .  albuterol (VENTOLIN HFA) 108 (90 Base) MCG/ACT inhaler TAKE 2 PUFFS BY MOUTH EVERY 6 HOURS AS NEEDED FOR WHEEZE OR SHORTNESS OF BREATH 6.7 g 0  . Azelastine-Fluticasone 137-50 MCG/ACT SUSP Place 1 spray into the nose 2 (two) times daily as needed (runny or stuffy nose.). 23 g 5  . EPINEPHrine 0.3 mg/0.3 mL IJ SOAJ injection SMARTSIG:0.3 Milliliter(s) IM Once PRN    . loratadine-pseudoephedrine (CLARITIN-D 24 HOUR) 10-240 MG 24 hr tablet Take 1 tablet by mouth daily. 30 tablet 3  . Multiple Vitamin (MULTIVITAMIN WITH MINERALS) TABS tablet Take 1 tablet by mouth 2 (two) times daily.    Marland Kitchen PARoxetine (PAXIL) 20 MG tablet Take 1 tablet (20 mg total) by mouth daily. Annual appt due in June must see provider for future refills 90 tablet 0  . valACYclovir (VALTREX) 1000 MG tablet Take 1 tablet (1,000 mg total) by mouth 2 (two) times daily. 60 tablet 3   No current facility-administered medications on file prior to visit.    Past Medical History:  Diagnosis Date  . Allergy   . Asthma   . Family history of BRCA1 gene positive   . Family history of breast cancer   . Family history of ovarian cancer     Past Surgical History:  Procedure Laterality Date  . NO PAST SURGERIES  Social History   Socioeconomic History  . Marital status: Single    Spouse name: Not on file  . Number of children: Not on file  . Years of education: Not on file  . Highest education level: Not on file  Occupational History  . Not on file  Tobacco Use  . Smoking status: Never Smoker  . Smokeless tobacco: Never Used  Vaping Use  . Vaping Use: Never used  Substance and Sexual Activity  . Alcohol use: No  . Drug use: No  . Sexual activity: Not on file  Other Topics Concern  . Not on file  Social History Narrative  . Not on file   Social Determinants of Health   Financial Resource Strain:   . Difficulty of Paying Living Expenses:   Food Insecurity:   . Worried About Charity fundraiser in the Last Year:    . Arboriculturist in the Last Year:   Transportation Needs:   . Film/video editor (Medical):   Marland Kitchen Lack of Transportation (Non-Medical):   Physical Activity:   . Days of Exercise per Week:   . Minutes of Exercise per Session:   Stress:   . Feeling of Stress :   Social Connections:   . Frequency of Communication with Friends and Family:   . Frequency of Social Gatherings with Friends and Family:   . Attends Religious Services:   . Active Member of Clubs or Organizations:   . Attends Archivist Meetings:   Marland Kitchen Marital Status:     Family History  Problem Relation Age of Onset  . Breast cancer Mother 59       recurred in 44's  . Hypertension Mother   . Alcohol abuse Father   . Hypertension Father   . Hypertension Maternal Grandmother   . Diabetes Maternal Grandmother   . Other Maternal Grandmother        hysterectomy in 40's  . Alcohol abuse Paternal Grandfather   . Ovarian cancer Other   . Breast cancer Other   . Allergic rhinitis Neg Hx   . Angioedema Neg Hx   . Asthma Neg Hx   . Eczema Neg Hx   . Atopy Neg Hx   . Immunodeficiency Neg Hx   . Urticaria Neg Hx     Review of Systems  Constitutional: Negative for chills and fever.  HENT: Positive for facial swelling. Negative for congestion, postnasal drip, sinus pressure, sinus pain and sore throat.   Respiratory: Negative for cough, shortness of breath and wheezing.   Neurological: Negative for headaches.       Objective:   Vitals:   07/25/19 1440  BP: 110/74  Pulse: (!) 102  Temp: 98.3 F (36.8 C)  SpO2: 98%   BP Readings from Last 3 Encounters:  07/25/19 110/74  07/24/19 120/86  07/03/19 120/80   Wt Readings from Last 3 Encounters:  07/25/19 132 lb 12.8 oz (60.2 kg)  07/24/19 134 lb (60.8 kg)  07/03/19 131 lb (59.4 kg)   Body mass index is 23.9 kg/m.   Physical Exam Constitutional:      General: She is not in acute distress.    Appearance: Normal appearance. She is not  ill-appearing.  HENT:     Head: Normocephalic and atraumatic.     Comments: Slight tenderness at the angle of the jaw.  No obvious swelling in the right cheek    Right Ear: Tympanic membrane, ear canal and external ear normal.  Left Ear: Tympanic membrane, ear canal and external ear normal.     Nose: Nose normal.     Mouth/Throat:     Mouth: Mucous membranes are moist.     Pharynx: No oropharyngeal exudate or posterior oropharyngeal erythema.  Eyes:     Conjunctiva/sclera: Conjunctivae normal.  Cardiovascular:     Rate and Rhythm: Normal rate and regular rhythm.     Heart sounds: Normal heart sounds.  Pulmonary:     Effort: Pulmonary effort is normal.     Breath sounds: Normal breath sounds.  Musculoskeletal:     Cervical back: No tenderness.     Right lower leg: No edema.     Left lower leg: No edema.  Lymphadenopathy:     Cervical: No cervical adenopathy.  Skin:    General: Skin is warm and dry.     Findings: No erythema or rash.  Neurological:     Mental Status: She is alert.            Assessment & Plan:    See Problem List for Assessment and Plan of chronic medical problems.    This visit occurred during the SARS-CoV-2 public health emergency.  Safety protocols were in place, including screening questions prior to the visit, additional usage of staff PPE, and extensive cleaning of exam room while observing appropriate contact time as indicated for disinfecting solutions.

## 2019-07-24 NOTE — Patient Instructions (Addendum)
Allergic rhinitis with conjunctivitis -Environmental allergy skin testing today is positive to grass pollen, weed pollen, tree pollen, molds, dust mites, cat, dog. -Allergen avoidance measures discussed/handouts provided -Try Xyzal 5 mg daily.  This is a long-acting antihistamine similar to Zyrtec and Claritin but may be more effective for you. -For watery, itching, red eyes use over-the-counter Pataday or Pataday Xtra strength 1 drop each eye daily as needed -Trial dymista 1 spray each nostril twice a day.  This is a combination nasal spray with Flonase + Astelin (nasal antihistamine).  This helps with both nasal congestion and drainage.  -Allergen immunotherapy discussed today including protocol, benefits and risk.  Informational handout provided.  If interested in this therapuetic option you can check with your insurance carrier for coverage.  Let us know if you would like to proceed with this option.    Food allergy Oral allergy syndrome -Select food allergy skin prick testing is positive to hazelnut and almond.  Advise avoidance of tree nuts and the diet. -Apple skin testing is negative.  This likely indicates an oral allergy syndrome due to her pollen allergy as above.  See below for further information. -Have access to self-injectable epinephrine (Epipen or AuviQ) 0.3mg  at all times -Follow emergency action plan in case of allergic reaction  Asthma -have access to albuterol inhaler 2 puffs every 4-6 hours as needed for cough/wheeze/shortness of breath/chest tightness.  May use 15-20 minutes prior to activity.   Monitor frequency of use.    Follow-up 4 months or sooner if needed   The oral allergy syndrome (OAS) or pollen-food allergy syndrome (PFAS) is a relatively common form of food allergy, particularly in adults. It typically occurs in people who have pollen allergies when the immune system "sees" proteins on the food that look like proteins on the pollen. This results in the allergy  antibody (IgE) binding to the food instead of the pollen. Patients typically report itching and/or mild swelling of the mouth and throat immediately following ingestion of certain uncooked fruits (including nuts) or raw vegetables. Only a very small number of affected individuals experience systemic allergic reactions, such as anaphylaxis which occurs with true food allergies.

## 2019-07-24 NOTE — Progress Notes (Signed)
New Patient Note  RE: Cindy Bates MRN: [id number] DOB: 05-Jan-1996 Date of Office Visit: 07/24/2019  Referring provider: Binnie Rail, MD Primary care provider: Binnie Rail, MD  Chief Complaint: allergies  History of present illness: Cindy Bates is a 24 y.o. female presenting today for consultation for allergic rhinitis.  Cindy Bates states that she has allergies "since I was born," and has noticed that with age she has acquired allergy like symptoms to tree nuts and fruit, in addition to her seasonal allergies.   Cindy Bates has had her seasonal allergies since she was a child. She states that her symptoms are year-round. Spring and Summer are her worst two seasons and Fall and Winter are "tolerable." Her allergy symptoms include runny nose, congestion, cough, and itchy/runny eyes. She is currently on Claritin D 24 hour and takes one pill a day, with moderate control of her symptoms. She did use Zyrtec and Claritin as a child and found no relief. She is on Flonase but does not use it. She has considered using a nasal clearing kit but admits that she finds the idea uncomfortable.   As for her food allergies, Cindy Bates states that over the past several years, she has noticed allergy like symptoms to nuts and apples. She states she first discovered her nut allergies, after eating a bag of trail mix. Shortly after eating the bag of trail mix, she felt that her skin "broke out," her throat tightened with difficulty swallowing secretions, bilateral cheek swelling, and had palpitations. She states that she took Benadryl with relief of her symptoms. She has since avoided all tree nuts after the exposure. She did have one other exposure this past thanksgiving with similar symptoms. She took 4 Benadryl with resolution of her symptoms. For apples, Cindy Bates noted that recently she at an apple and felt that it was difficult for her to swallow afterwards. She has been avoiding apples since that incident. She can eat fish, shellfish,  and other fruits, vegetables, and meats without symptoms.   She does have a history of asthma that she states is usually of flareup by her allergies.  She uses albuterol as needed on average about once a month.  She does not have any other medications for asthma control.  She denies any need for systemic steroids.  She denies any nighttime awakenings.  Review of systems: Review of Systems  Constitutional: Negative.   HENT:       See HPI  Eyes:       See HPI  Respiratory: Negative.   Cardiovascular: Negative.   Gastrointestinal: Negative.   Musculoskeletal: Negative.   Skin: Negative.   Neurological: Negative.     All other systems negative unless noted above in HPI  Past medical history: Past Medical History:  Diagnosis Date  . Allergy   . Asthma   . Family history of BRCA1 gene positive   . Family history of breast cancer   . Family history of ovarian cancer     Past surgical history: Past Surgical History:  Procedure Laterality Date  . NO PAST SURGERIES      Family history:  Family History  Problem Relation Age of Onset  . Breast cancer Mother 17       recurred in 11's  . Hypertension Mother   . Alcohol abuse Father   . Hypertension Father   . Hypertension Maternal Grandmother   . Diabetes Maternal Grandmother   . Other Maternal Grandmother  hysterectomy in 40's  . Alcohol abuse Paternal Grandfather   . Ovarian cancer Other   . Breast cancer Other   . Allergic rhinitis Neg Hx   . Angioedema Neg Hx   . Asthma Neg Hx   . Eczema Neg Hx   . Atopy Neg Hx   . Immunodeficiency Neg Hx   . Urticaria Neg Hx     Social history: She lives in a home without carpeting with gas heating and central cooling.  Dog in the home.  No concern for water damage, mildew or roaches inside the home.  She denies a smoking history.  Medication List: Current Outpatient Medications  Medication Sig Dispense Refill  . albuterol (VENTOLIN HFA) 108 (90 Base) MCG/ACT inhaler  TAKE 2 PUFFS BY MOUTH EVERY 6 HOURS AS NEEDED FOR WHEEZE OR SHORTNESS OF BREATH 6.7 g 0  . loratadine-pseudoephedrine (CLARITIN-D 24 HOUR) 10-240 MG 24 hr tablet Take 1 tablet by mouth daily. 30 tablet 3  . Multiple Vitamin (MULTIVITAMIN WITH MINERALS) TABS tablet Take 1 tablet by mouth 2 (two) times daily.    Marland Kitchen PARoxetine (PAXIL) 20 MG tablet Take 1 tablet (20 mg total) by mouth daily. Annual appt due in June must see provider for future refills 90 tablet 0  . valACYclovir (VALTREX) 1000 MG tablet Take 1 tablet (1,000 mg total) by mouth 2 (two) times daily. 60 tablet 3  . EPINEPHrine 0.3 mg/0.3 mL IJ SOAJ injection Inject 0.3 mLs (0.3 mg total) into the muscle once for 1 dose. As needed for life-threatening allergic reactions 2 each 1   No current facility-administered medications for this visit.    Known medication allergies: Allergies  Allergen Reactions  . Apple Anaphylaxis, Itching, Shortness Of Breath and Swelling  . Apple Cider Vinegar Anaphylaxis  . Peanut (Diagnostic) Anaphylaxis and Shortness Of Breath  . Pear Anaphylaxis     Physical examination: Blood pressure 120/86, pulse (!) 112, temperature 97.6 F (36.4 C), temperature source Temporal, resp. rate 12, height 5' 2.5" (1.588 m), weight 134 lb (60.8 kg), last menstrual period 06/27/2019, SpO2 99 %.  General: Alert, interactive, in no acute distress. HEENT: PERRLA, TMs pearly [place], turbinates moderately edematous without discharge, post-pharynx non erythematous. Neck: Supple without lymphadenopathy. Lungs: Clear to auscultation without wheezing, rhonchi or rales. {no increased work of breathing. CV: Normal S1, S2 without murmurs. Abdomen: Nondistended, nontender. Skin: Warm and dry, without lesions or rashes. Extremities:  No clubbing, cyanosis or edema. Neuro:   Grossly intact.  Diagnositics/Labs:  Allergy testing: Environmental allergy skin prick testing is positive to grass pollens, tree pollens, Fusarium, botrytis,  epicoccum, both dust mites, cat, dog. Intradermal testing is positive to ragweed mix, weed mix, mold mix 1, 2, 3. Select food allergy skin prick testing is positive to hazelnut and almond.  Negative to peanut, remaining tree nut panel and alcohol. Allergy testing results were read and interpreted by provider, documented by clinical staff.   Assessment and plan:   Allergic rhinitis with conjunctivitis -Environmental allergy skin testing today is positive to grass pollen, weed pollen, tree pollen, molds, dust mites, cat, dog. -Allergen avoidance measures discussed/handouts provided -Try Xyzal 5 mg daily.  This is a long-acting antihistamine similar to Zyrtec and Claritin but may be more effective for you. -For watery, itching, red eyes use over-the-counter Pataday or Pataday Xtra strength 1 drop each eye daily as needed -Trial dymista 1 spray each nostril twice a day.  This is a combination nasal spray with Flonase + Astelin (nasal antihistamine).  This helps with both nasal congestion and drainage.  -Allergen immunotherapy discussed today including protocol, benefits and risk.  Informational handout provided.  If interested in this therapuetic option you can check with your insurance carrier for coverage.  Let us know if you would like to proceed with this option.    Food allergy Oral allergy syndrome -Select food allergy skin prick testing is positive to hazelnut and almond.  Advise avoidance of tree nuts and the diet. -Apple skin testing is negative.  This likely indicates an oral allergy syndrome due to her pollen allergy as above.  See below for further information. -Have access to self-injectable epinephrine (Epipen or AuviQ) 0.61m at all times -Follow emergency action plan in case of allergic reaction  Asthma -have access to albuterol inhaler 2 puffs every 4-6 hours as needed for cough/wheeze/shortness of breath/chest tightness.  May use 15-20 minutes prior to activity.   Monitor  frequency of use.    Follow-up 4 months or sooner if needed   The oral allergy syndrome (OAS) or pollen-food allergy syndrome (PFAS) is a relatively common form of food allergy, particularly in adults. It typically occurs in people who have pollen allergies when the immune system "sees" proteins on the food that look like proteins on the pollen. This results in the allergy antibody (IgE) binding to the food instead of the pollen. Patients typically report itching and/or mild swelling of the mouth and throat immediately following ingestion of certain uncooked fruits (including nuts) or raw vegetables. Only a very small number of affected individuals experience systemic allergic reactions, such as anaphylaxis which occurs with true food allergies.     I appreciate the opportunity to take part in Kristiane's care. Please do not hesitate to contact me with questions.  Sincerely,   SPrudy Feeler MD [facility]ergy[facility] [place]

## 2019-07-25 ENCOUNTER — Ambulatory Visit (INDEPENDENT_AMBULATORY_CARE_PROVIDER_SITE_OTHER): Payer: No Typology Code available for payment source | Admitting: Internal Medicine

## 2019-07-25 ENCOUNTER — Encounter: Payer: Self-pay | Admitting: Internal Medicine

## 2019-07-25 VITALS — BP 110/74 | HR 102 | Temp 98.3°F | Ht 62.5 in | Wt 132.8 lb

## 2019-07-25 DIAGNOSIS — K1120 Sialoadenitis, unspecified: Secondary | ICD-10-CM | POA: Insufficient documentation

## 2019-07-25 MED ORDER — AMOXICILLIN-POT CLAVULANATE 875-125 MG PO TABS: 1.0000 | ORAL_TABLET | Freq: Two times a day (BID) | ORAL | 0 refills | Status: DC

## 2019-07-25 NOTE — Assessment & Plan Note (Signed)
Subacute Symptoms consistent with possible parotitis Symptoms improved with Augmentin and almost completely resolved Augmentin for an additional 7 days Advised to increase water intake, suck on hard sour lemon candy Massage parotid gland She will call if there is no improvement in which case I will arrange for imaging

## 2019-07-25 NOTE — Patient Instructions (Signed)
Increase your water intake.  Suck on sour lemon candy.  Massage your parotid gland.  Take the antibiotic as prescribed.  Please call if there is no improvement in your symptoms.      Parotitis  Parotitis is inflammation of one or both of your parotid glands. These glands produce saliva. They are found on each side of your face, below and in front of your earlobes. The saliva that they produce comes out of tiny openings (ducts) inside your cheeks. Parotitis may cause sudden swelling and pain (acute parotitis). It can also cause repeated episodes of swelling and pain or continued swelling that may or may not be painful (chronic parotitis). What are the causes? This condition may be caused by:  Infections from bacteria.  Infections from viruses, such as mumps or HIV.  Blockage (obstruction) of saliva flow through the parotid glands. This can be from a stone, scar tissue, or a tumor.  Diseases that cause your body's defense system (immune system) to attack healthy cells in your salivary glands. These are called autoimmune diseases. What increases the risk? You are more likely to develop this condition if:  You are 17 years old or older.  You do not drink enough fluids (are dehydrated).  You drink too much alcohol.  You have: ? A dry mouth. ? Poor dental hygiene. ? Diabetes. ? Gout. ? A long-term illness.  You have had radiation treatments to the head and neck.  You take certain medicines. What are the signs or symptoms? Symptoms of this condition depend on the cause. Symptoms may include:  Swelling under and in front of the ear. This may get worse after eating.  Redness of the skin over the parotid gland.  Pain and tenderness over the parotid gland. This may get worse after eating.  Fever or chills.  Pus coming from the ducts inside the mouth.  Dry mouth.  A bad taste in the mouth. How is this diagnosed? This condition may be diagnosed based on:  Your medical  history.  A physical exam.  Tests to find the cause of the parotitis. These may include: ? Doing blood tests to check for an autoimmune disease or infections from a virus. ? Taking a fluid sample from the parotid gland and testing it for infection. ? Injecting the ducts of the parotid gland with a dye and then taking X-rays (sialogram). ? Having other imaging tests of the gland, such as X-rays, ultrasound, MRI, or CT scan. ? Checking the opening of the gland for a stone or obstruction. ? Placing a needle into the gland to remove tissue for a biopsy (fine needle aspiration). How is this treated? Treatment for this condition depends on the cause. Treatment may include:  Antibiotic medicine for a bacterial infection.  Drinking more fluids.  Removing a stone or obstruction.  Treating an underlying disease that is causing parotitis.  Surgery to drain an infection, remove a growth, or remove the whole gland (parotidectomy). Treatment may not be needed if parotid swelling goes away with home care. Follow these instructions at home: Medicines   Take over-the-counter and prescription medicines only as told by your health care provider.  If you were prescribed an antibiotic medicine, take it as told by your health care provider. Do not stop taking the antibiotic even if you start to feel better. Managing pain and swelling  If directed, apply heat to the affected area as often as told by your health care provider. Use the heat source that your  health care provider recommends, such as a moist heat pack or a heating pad. To apply the heat: ? Place a towel between your skin and the heat source. ? Leave the heat on for 20-30 minutes. ? Remove the heat if your skin turns bright red. This is especially important if you are unable to feel pain, heat, or cold. You may have a greater risk of getting burned.  Gargle with a salt-water mixture 3-4 times a day or as needed. To make a salt-water  mixture, completely dissolve -1 tsp (3-6 g) of salt in 1 cup (237 mL) of warm water.  Gently massage the parotid glands as told by your health care provider. General instructions   Drink enough fluid to keep your urine pale yellow.  Keep your mouth clean and moist.  Try sucking on sour candy. This may help to make your mouth less dry by stimulating the flow of saliva.  Maintain good oral health. ? Brush your teeth at least two times a day. ? Floss your teeth every day. ? See your dentist regularly.  Do not use any products that contain nicotine or tobacco, such as cigarettes, e-cigarettes, and chewing tobacco. If you need help quitting, ask your health care provider.  Do not drink alcohol.  Keep all follow-up visits as told by your health care provider. This is important. Contact a health care provider if:  You have a fever or chills.  You have new symptoms.  Your symptoms get worse.  Your symptoms do not improve with treatment. Get help right away if:  You have difficulty breathing or swallowing because of the swollen gland. Summary  Parotitis is inflammation of one or both of your parotid glands.  Symptoms include pain and swelling under and in front of the ear. They may also include a fever and a bad taste in your mouth.  This condition may be treated with antibiotics, increasing fluids, or surgery.  In some cases, parotitis may go away on its own without treatment.  You should drink plenty of fluids, maintain good oral hygiene, and avoid tobacco products. This information is not intended to replace advice given to you by your health care provider. Make sure you discuss any questions you have with your health care provider. Document Revised: 07/31/2017 Document Reviewed: 07/31/2017 Elsevier Patient Education  2020 [employer].

## 2019-08-01 ENCOUNTER — Encounter: Payer: Self-pay | Admitting: Internal Medicine

## 2019-08-02 DIAGNOSIS — H5203 Hypermetropia, bilateral: Secondary | ICD-10-CM | POA: Diagnosis not present

## 2019-08-11 ENCOUNTER — Encounter: Payer: Self-pay | Admitting: Internal Medicine

## 2019-08-18 DIAGNOSIS — Z3042 Encounter for surveillance of injectable contraceptive: Secondary | ICD-10-CM | POA: Diagnosis not present

## 2019-08-18 DIAGNOSIS — Z3202 Encounter for pregnancy test, result negative: Secondary | ICD-10-CM | POA: Diagnosis not present

## 2019-09-02 ENCOUNTER — Other Ambulatory Visit: Payer: No Typology Code available for payment source

## 2019-09-02 ENCOUNTER — Encounter: Payer: Self-pay | Admitting: Internal Medicine

## 2019-09-04 ENCOUNTER — Telehealth: Payer: No Typology Code available for payment source | Admitting: Physician Assistant

## 2019-09-04 DIAGNOSIS — L709 Acne, unspecified: Secondary | ICD-10-CM

## 2019-09-04 DIAGNOSIS — L739 Follicular disorder, unspecified: Secondary | ICD-10-CM

## 2019-09-04 MED ORDER — CLINDAMYCIN PHOS-BENZOYL PEROX 1-5 % EX GEL: Freq: Two times a day (BID) | CUTANEOUS | 0 refills | Status: DC

## 2019-09-04 MED ORDER — DOXYCYCLINE HYCLATE 100 MG PO TABS: 100.0000 mg | ORAL_TABLET | Freq: Two times a day (BID) | ORAL | 0 refills | Status: DC

## 2019-09-04 NOTE — Progress Notes (Signed)
We are sorry that you are experiencing this issue.  Here is how we plan to help!  Based on what you shared with me it looks like you have uncomplicated acne on your face and possible folliculitis in your groin/arm.  Acne is a disorder of the hair follicles and oil glands (sebaceous glands). The sebaceous glands secrete oils to keep the skin moist.  When the glands get clogged, it can lead to pimples or cysts.  These cysts may become infected and leave scars. Acne is very common and normally occurs at puberty.  Acne is also inherited.  Your personal care plan consists of the following recommendations:  I have prescribed a topical gel with an antibiotic:  Clindamycin-benzoyl peroxide gel.  This gel should be applied to the affected areas twice a day.  Be sure to read the package insert to understand potential side effects.  I have also prescribed one of the following additional therapies:  Doxycycline an oral antibiotic 100 mg twice a day  If excessive dryness or peeling occurs, reduce dose frequency or concentration of the topical scrubs.  If excessive stinging or burning occurs, remove the topical gel with mild soap and water and resume at a lower dose the next day.  Remember oral antibiotics and topical acne treatments may increase your sensitivity to the sun!  HOME CARE:  Do not squeeze pimples because that can often lead to infections, worse acne, and scars.  Use a moisturizer that contains retinoid or fruit acids that may inhibit the development of new acne lesions.  Although there is not a clear link that foods can cause acne, doctors do believe that too many sweets predispose you to skin problems.  GET HELP RIGHT AWAY IF:  If your acne gets worse or is not better within 10 days.  If you become depressed.  If you become pregnant, discontinue medications and call your OB/GYN.  MAKE SURE YOU:  Understand these instructions.  Will watch your condition.  Will get help right  away if you are not doing well or get worse.   Your e-visit answers were reviewed by a board certified advanced clinical practitioner to complete your personal care plan.  Depending upon the condition, your plan could have included both over the counter or prescription medications.  Please review your pharmacy choice.  If there is a problem, you may contact your provider through [employer] and have the prescription routed to another pharmacy.  Your safety is important to [place].  If you have drug allergies check your prescription carefully.  For the next 24 hours you can use MyChart to ask questions about today's visit, request a non-urgent call back, or ask for a work or school excuse from your e-visit provider.  You will get an email in the next two days asking about your experience. I hope that your e-visit has been valuable and will speed your recovery.  Greater than 5 minutes, yet less than 10 minutes of time have been spent researching, coordinating, and implementing care for this patient today.

## 2019-09-09 ENCOUNTER — Other Ambulatory Visit: Payer: Self-pay | Admitting: Internal Medicine

## 2019-09-09 DIAGNOSIS — Z20822 Contact with and (suspected) exposure to covid-19: Secondary | ICD-10-CM | POA: Diagnosis not present

## 2019-09-09 DIAGNOSIS — U071 COVID-19: Secondary | ICD-10-CM | POA: Diagnosis not present

## 2019-10-09 ENCOUNTER — Encounter: Payer: Self-pay | Admitting: Internal Medicine

## 2019-10-30 DIAGNOSIS — R69 Illness, unspecified: Secondary | ICD-10-CM | POA: Diagnosis not present

## 2019-10-31 DIAGNOSIS — R69 Illness, unspecified: Secondary | ICD-10-CM | POA: Diagnosis not present

## 2019-10-31 DIAGNOSIS — F411 Generalized anxiety disorder: Secondary | ICD-10-CM | POA: Diagnosis not present

## 2019-11-03 DIAGNOSIS — Z3042 Encounter for surveillance of injectable contraceptive: Secondary | ICD-10-CM | POA: Diagnosis not present

## 2019-11-18 DIAGNOSIS — F411 Generalized anxiety disorder: Secondary | ICD-10-CM | POA: Diagnosis not present

## 2019-11-18 DIAGNOSIS — R69 Illness, unspecified: Secondary | ICD-10-CM | POA: Diagnosis not present

## 2019-12-03 DIAGNOSIS — R69 Illness, unspecified: Secondary | ICD-10-CM | POA: Diagnosis not present

## 2019-12-03 DIAGNOSIS — R45850 Homicidal ideations: Secondary | ICD-10-CM | POA: Diagnosis not present

## 2019-12-03 DIAGNOSIS — F411 Generalized anxiety disorder: Secondary | ICD-10-CM | POA: Diagnosis not present

## 2019-12-23 DIAGNOSIS — R69 Illness, unspecified: Secondary | ICD-10-CM | POA: Diagnosis not present

## 2019-12-23 DIAGNOSIS — R45850 Homicidal ideations: Secondary | ICD-10-CM | POA: Diagnosis not present

## 2020-01-01 DIAGNOSIS — L309 Dermatitis, unspecified: Secondary | ICD-10-CM | POA: Diagnosis not present

## 2020-01-05 DIAGNOSIS — L089 Local infection of the skin and subcutaneous tissue, unspecified: Secondary | ICD-10-CM | POA: Diagnosis not present

## 2020-01-05 DIAGNOSIS — L669 Cicatricial alopecia, unspecified: Secondary | ICD-10-CM | POA: Diagnosis not present

## 2020-01-05 DIAGNOSIS — L732 Hidradenitis suppurativa: Secondary | ICD-10-CM | POA: Diagnosis not present

## 2020-01-05 DIAGNOSIS — Z79899 Other long term (current) drug therapy: Secondary | ICD-10-CM | POA: Diagnosis not present

## 2020-01-05 DIAGNOSIS — L658 Other specified nonscarring hair loss: Secondary | ICD-10-CM | POA: Diagnosis not present

## 2020-01-20 DIAGNOSIS — F4311 Post-traumatic stress disorder, acute: Secondary | ICD-10-CM | POA: Diagnosis not present

## 2020-01-20 DIAGNOSIS — F419 Anxiety disorder, unspecified: Secondary | ICD-10-CM | POA: Diagnosis not present

## 2020-01-20 DIAGNOSIS — R69 Illness, unspecified: Secondary | ICD-10-CM | POA: Diagnosis not present

## 2020-01-20 DIAGNOSIS — F32A Depression, unspecified: Secondary | ICD-10-CM | POA: Diagnosis not present

## 2020-01-23 DIAGNOSIS — Z3042 Encounter for surveillance of injectable contraceptive: Secondary | ICD-10-CM | POA: Diagnosis not present

## 2020-01-27 DIAGNOSIS — F419 Anxiety disorder, unspecified: Secondary | ICD-10-CM | POA: Diagnosis not present

## 2020-01-27 DIAGNOSIS — F4310 Post-traumatic stress disorder, unspecified: Secondary | ICD-10-CM | POA: Diagnosis not present

## 2020-01-27 DIAGNOSIS — F32A Depression, unspecified: Secondary | ICD-10-CM | POA: Diagnosis not present

## 2020-01-27 DIAGNOSIS — R69 Illness, unspecified: Secondary | ICD-10-CM | POA: Diagnosis not present

## 2020-01-29 DIAGNOSIS — R69 Illness, unspecified: Secondary | ICD-10-CM | POA: Diagnosis not present

## 2020-02-04 DIAGNOSIS — Z03818 Encounter for observation for suspected exposure to other biological agents ruled out: Secondary | ICD-10-CM | POA: Diagnosis not present

## 2020-02-04 DIAGNOSIS — Z20822 Contact with and (suspected) exposure to covid-19: Secondary | ICD-10-CM | POA: Diagnosis not present

## 2020-02-12 DIAGNOSIS — L858 Other specified epidermal thickening: Secondary | ICD-10-CM | POA: Insufficient documentation

## 2020-02-12 DIAGNOSIS — L249 Irritant contact dermatitis, unspecified cause: Secondary | ICD-10-CM | POA: Diagnosis not present

## 2020-02-12 DIAGNOSIS — L299 Pruritus, unspecified: Secondary | ICD-10-CM | POA: Diagnosis not present

## 2020-02-12 DIAGNOSIS — L739 Follicular disorder, unspecified: Secondary | ICD-10-CM | POA: Diagnosis not present

## 2020-04-09 DIAGNOSIS — Z3042 Encounter for surveillance of injectable contraceptive: Secondary | ICD-10-CM | POA: Diagnosis not present

## 2020-04-11 DIAGNOSIS — F419 Anxiety disorder, unspecified: Secondary | ICD-10-CM | POA: Diagnosis not present

## 2020-04-11 DIAGNOSIS — R69 Illness, unspecified: Secondary | ICD-10-CM | POA: Diagnosis not present

## 2020-04-12 DIAGNOSIS — R69 Illness, unspecified: Secondary | ICD-10-CM | POA: Diagnosis not present

## 2020-04-26 DIAGNOSIS — R69 Illness, unspecified: Secondary | ICD-10-CM | POA: Diagnosis not present

## 2020-04-30 DIAGNOSIS — R69 Illness, unspecified: Secondary | ICD-10-CM | POA: Diagnosis not present

## 2020-05-10 DIAGNOSIS — Z124 Encounter for screening for malignant neoplasm of cervix: Secondary | ICD-10-CM | POA: Diagnosis not present

## 2020-05-10 DIAGNOSIS — R69 Illness, unspecified: Secondary | ICD-10-CM | POA: Diagnosis not present

## 2020-05-10 DIAGNOSIS — Z6825 Body mass index (BMI) 25.0-25.9, adult: Secondary | ICD-10-CM | POA: Diagnosis not present

## 2020-05-10 DIAGNOSIS — Z113 Encounter for screening for infections with a predominantly sexual mode of transmission: Secondary | ICD-10-CM | POA: Diagnosis not present

## 2020-05-10 DIAGNOSIS — Z3042 Encounter for surveillance of injectable contraceptive: Secondary | ICD-10-CM | POA: Diagnosis not present

## 2020-05-10 DIAGNOSIS — Z01411 Encounter for gynecological examination (general) (routine) with abnormal findings: Secondary | ICD-10-CM | POA: Diagnosis not present

## 2020-05-10 DIAGNOSIS — Z118 Encounter for screening for other infectious and parasitic diseases: Secondary | ICD-10-CM | POA: Diagnosis not present

## 2020-05-10 DIAGNOSIS — Z01419 Encounter for gynecological examination (general) (routine) without abnormal findings: Secondary | ICD-10-CM | POA: Diagnosis not present

## 2020-05-11 ENCOUNTER — Encounter: Payer: No Typology Code available for payment source | Admitting: Internal Medicine

## 2020-05-13 NOTE — Patient Instructions (Signed)
Blood work was ordered.     Medications changes include :     Your prescription(s) have been submitted to your pharmacy. Please take as directed and contact our office if you believe you are having problem(s) with the medication(s).   A referral was ordered for        Someone from their office will call you to schedule an appointment.    Please followup in 6 months   Health Maintenance, Female Adopting a healthy lifestyle and getting preventive care are important in promoting health and wellness. Ask your health care provider about:  The right schedule for you to have regular tests and exams.  Things you can do on your own to prevent diseases and keep yourself healthy. What should I know about diet, weight, and exercise? Eat a healthy diet  Eat a diet that includes plenty of vegetables, fruits, low-fat dairy products, and lean protein.  Do not eat a lot of foods that are high in solid fats, added sugars, or sodium.   Maintain a healthy weight Body mass index (BMI) is used to identify weight problems. It estimates body fat based on height and weight. Your health care provider can help determine your BMI and help you achieve or maintain a healthy weight. Get regular exercise Get regular exercise. This is one of the most important things you can do for your health. Most adults should:  Exercise for at least 150 minutes each week. The exercise should increase your heart rate and make you sweat (moderate-intensity exercise).  Do strengthening exercises at least twice a week. This is in addition to the moderate-intensity exercise.  Spend less time sitting. Even light physical activity can be beneficial. Watch cholesterol and blood lipids Have your blood tested for lipids and cholesterol at 23 years of age, then have this test every 5 years. Have your cholesterol levels checked more often if:  Your lipid or cholesterol levels are high.  You are older than 23 years of age.  You  are at high risk for heart disease. What should I know about cancer screening? Depending on your health history and family history, you may need to have cancer screening at various ages. This may include screening for:  Breast cancer.  Cervical cancer.  Colorectal cancer.  Skin cancer.  Lung cancer. What should I know about heart disease, diabetes, and high blood pressure? Blood pressure and heart disease  High blood pressure causes heart disease and increases the risk of stroke. This is more likely to develop in people who have high blood pressure readings, are of African descent, or are overweight.  Have your blood pressure checked: ? Every 3-5 years if you are 18-39 years of age. ? Every year if you are 40 years old or older. Diabetes Have regular diabetes screenings. This checks your fasting blood sugar level. Have the screening done:  Once every three years after age 40 if you are at a normal weight and have a low risk for diabetes.  More often and at a younger age if you are overweight or have a high risk for diabetes. What should I know about preventing infection? Hepatitis B If you have a higher risk for hepatitis B, you should be screened for this virus. Talk with your health care provider to find out if you are at risk for hepatitis B infection. Hepatitis C Testing is recommended for:  Everyone born from 1945 through 1965.  Anyone with known risk factors for hepatitis C. Sexually transmitted   infections (STIs)  Get screened for STIs, including gonorrhea and chlamydia, if: ? You are sexually active and are younger than 24 years of age. ? You are older than 24 years of age and your health care provider tells you that you are at risk for this type of infection. ? Your sexual activity has changed since you were last screened, and you are at increased risk for chlamydia or gonorrhea. Ask your health care provider if you are at risk.  Ask your health care provider about  whether you are at high risk for HIV. Your health care provider may recommend a prescription medicine to help prevent HIV infection. If you choose to take medicine to prevent HIV, you should first get tested for HIV. You should then be tested every 3 months for as long as you are taking the medicine. Pregnancy  If you are about to stop having your period (premenopausal) and you may become pregnant, seek counseling before you get pregnant.  Take 400 to 800 micrograms (mcg) of folic acid every day if you become pregnant.  Ask for birth control (contraception) if you want to prevent pregnancy. Osteoporosis and menopause Osteoporosis is a disease in which the bones lose minerals and strength with aging. This can result in bone fractures. If you are 65 years old or older, or if you are at risk for osteoporosis and fractures, ask your health care provider if you should:  Be screened for bone loss.  Take a calcium or vitamin D supplement to lower your risk of fractures.  Be given hormone replacement therapy (HRT) to treat symptoms of menopause. Follow these instructions at home: Lifestyle  Do not use any products that contain nicotine or tobacco, such as cigarettes, e-cigarettes, and chewing tobacco. If you need help quitting, ask your health care provider.  Do not use street drugs.  Do not share needles.  Ask your health care provider for help if you need support or information about quitting drugs. Alcohol use  Do not drink alcohol if: ? Your health care provider tells you not to drink. ? You are pregnant, may be pregnant, or are planning to become pregnant.  If you drink alcohol: ? Limit how much you use to 0-1 drink a day. ? Limit intake if you are breastfeeding.  Be aware of how much alcohol is in your drink. In the [place] one drink equals one 12 oz bottle of beer (355 mL), one 5 oz glass of wine (148 mL), or one 1 oz glass of hard liquor (44 mL). General instructions  Schedule  regular health, dental, and eye exams.  Stay current with your vaccines.  Tell your health care provider if: ? You often feel depressed. ? You have ever been abused or do not feel safe at home. Summary  Adopting a healthy lifestyle and getting preventive care are important in promoting health and wellness.  Follow your health care provider's instructions about healthy diet, exercising, and getting tested or screened for diseases.  Follow your health care provider's instructions on monitoring your cholesterol and blood pressure. This information is not intended to replace advice given to you by your health care provider. Make sure you discuss any questions you have with your health care provider. Document Revised: 12/26/2017 Document Reviewed: 12/26/2017 Elsevier Patient Education  2021 Elsevier Inc.  

## 2020-05-13 NOTE — Progress Notes (Signed)
Subjective:    Patient ID: Cindy Bates, female    DOB: 08-Jan-1996, 25 y.o.   MRN: [id number]   This visit occurred during the SARS-CoV-2 public health emergency.  Safety protocols were in place, including screening questions prior to the visit, additional usage of staff PPE, and extensive cleaning of exam room while observing appropriate contact time as indicated for disinfecting solutions.    HPI She is here for a physical exam.     Medications and allergies reviewed with patient and updated if appropriate.  Patient Active Problem List   Diagnosis Date Noted  . Parotitis 07/25/2019  . Sinusitis 07/03/2019  . Suspected COVID-19 virus infection 12/05/2018  . BPPV (benign paroxysmal positional vertigo) 09/06/2018  . Anxiety 06/26/2018  . Sleep difficulties 06/26/2018  . Fatigue 06/21/2017  . 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 Genetic testing 03/09/2017  . 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 Asthma 02/09/2017  . 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 Family history of BRCA1 gene positive   . Family history of breast cancer   . Family history of ovarian cancer     Current Outpatient Medications on File Prior to Visit  Medication Sig Dispense Refill  . albuterol (VENTOLIN HFA) 108 (90 Base) MCG/ACT inhaler TAKE 2 PUFFS BY MOUTH EVERY 6 HOURS AS NEEDED FOR WHEEZE OR SHORTNESS OF BREATH 6.7 g 0  . Azelastine-Fluticasone 137-50 MCG/ACT SUSP Place 1 spray into the nose 2 (two) times daily as needed (runny or stuffy nose.). 23 g 5  . clindamycin-benzoyl peroxide (BENZACLIN) gel Apply topically 2 (two) times daily. 25 g 0  . doxycycline (VIBRA-TABS) 100 MG tablet Take 1 tablet (100 mg total) by mouth 2 (two) times daily. 20 tablet 0  . EPINEPHrine 0.3 mg/0.3 mL IJ SOAJ injection SMARTSIG:0.3 Milliliter(s) IM Once PRN    . loratadine-pseudoephedrine (CLARITIN-D 24 HOUR) 10-240 MG 24 hr tablet Take 1 tablet by mouth daily. 30 tablet 3  . Multiple Vitamin (MULTIVITAMIN WITH MINERALS) TABS tablet Take 1 tablet  by mouth 2 (two) times daily.    Marland Kitchen PARoxetine (PAXIL) 20 MG tablet Take 1 tablet (20 mg total) by mouth daily. Annual appt due in June must see provider for future refills 90 tablet 0  . valACYclovir (VALTREX) 1000 MG tablet TAKE 1 TABLET BY MOUTH TWICE A DAY 180 tablet 1   No current facility-administered medications on file prior to visit.    Past Medical History:  Diagnosis Date  . Allergy   . Asthma   . Family history of BRCA1 gene positive   . Family history of breast cancer   . Family history of ovarian cancer     Past Surgical History:  Procedure Laterality Date  . NO PAST SURGERIES      Social History   Socioeconomic History  . Marital status: Single    Spouse name: Not on file  . Number of children: Not on file  . Years of education: Not on file  . Highest education level: Not on file  Occupational History  . Not on file  Tobacco Use  . Smoking status: Never Smoker  . Smokeless tobacco: Never Used  Vaping Use  . Vaping Use: Never used  Substance and Sexual Activity  . Alcohol use: No  . Drug use: No  . Sexual activity: Not on file  Other Topics Concern  . Not on file  Social History Narrative  . Not on file   Social Determinants of Health   Financial Resource Strain: Not on  file  Food Insecurity: Not on file  Transportation Needs: Not on file  Physical Activity: Not on file  Stress: Not on file  Social Connections: Not on file    Family History  Problem Relation Age of Onset  . Breast cancer Mother 68       recurred in 61's  . Hypertension Mother   . Alcohol abuse Father   . Hypertension Father   . Hypertension Maternal Grandmother   . Diabetes Maternal Grandmother   . Other Maternal Grandmother        hysterectomy in 40's  . Alcohol abuse Paternal Grandfather   . Ovarian cancer Other   . Breast cancer Other   . Allergic rhinitis Neg Hx   . Angioedema Neg Hx   . Asthma Neg Hx   . Eczema Neg Hx   . Atopy Neg Hx   . Immunodeficiency Neg  Hx   . Urticaria Neg Hx     Review of Systems     Objective:  There were no vitals filed for this visit. There were no vitals filed for this visit. There is no height or weight on file to calculate BMI.  BP Readings from Last 3 Encounters:  07/25/19 110/74  07/24/19 120/86  07/03/19 120/80    Wt Readings from Last 3 Encounters:  07/25/19 132 lb 12.8 oz (60.2 kg)  07/24/19 134 lb (60.8 kg)  07/03/19 131 lb (59.4 kg)     Physical Exam Constitutional: She appears well-developed and well-nourished. No distress.  HENT:  Head: Normocephalic and atraumatic.  Right Ear: External ear normal. Normal ear canal and TM Left Ear: External ear normal.  Normal ear canal and TM Mouth/Throat: Oropharynx is clear and moist.  Eyes: Conjunctivae and EOM are normal.  Neck: Neck supple. No tracheal deviation present. No thyromegaly present.  No carotid bruit  Cardiovascular: Normal rate, regular rhythm and normal heart sounds.   No murmur heard.  No edema. Pulmonary/Chest: Effort normal and breath sounds normal. No respiratory distress. She has no wheezes. She has no rales.  Breast: deferred   Abdominal: Soft. She exhibits no distension. There is no tenderness.  Lymphadenopathy: She has no cervical adenopathy.  Skin: Skin is warm and dry. She is not diaphoretic.  Psychiatric: She has a normal mood and affect. Her behavior is normal.        Assessment & Plan:   Physical exam: Screening blood work    ordered Immunizations  Discussed HPV, covid Gyn   Exercise   Weight  normal Substance abuse  none      See Problem List for Assessment and Plan of chronic medical problems.      This encounter was created in error - please disregard.

## 2020-05-14 ENCOUNTER — Encounter: Payer: No Typology Code available for payment source | Admitting: Internal Medicine

## 2020-05-14 DIAGNOSIS — Z0000 Encounter for general adult medical examination without abnormal findings: Secondary | ICD-10-CM

## 2020-05-14 DIAGNOSIS — B009 Herpesviral infection, unspecified: Secondary | ICD-10-CM

## 2020-05-18 NOTE — Patient Instructions (Signed)
Blood work was ordered.     Medications changes include :     Your prescription(s) have been submitted to your pharmacy. Please take as directed and contact our office if you believe you are having problem(s) with the medication(s).   A referral was ordered for        Someone from their office will call you to schedule an appointment.    Please followup in 6 months   Health Maintenance, Female Adopting a healthy lifestyle and getting preventive care are important in promoting health and wellness. Ask your health care provider about:  The right schedule for you to have regular tests and exams.  Things you can do on your own to prevent diseases and keep yourself healthy. What should I know about diet, weight, and exercise? Eat a healthy diet  Eat a diet that includes plenty of vegetables, fruits, low-fat dairy products, and lean protein.  Do not eat a lot of foods that are high in solid fats, added sugars, or sodium.   Maintain a healthy weight Body mass index (BMI) is used to identify weight problems. It estimates body fat based on height and weight. Your health care provider can help determine your BMI and help you achieve or maintain a healthy weight. Get regular exercise Get regular exercise. This is one of the most important things you can do for your health. Most adults should:  Exercise for at least 150 minutes each week. The exercise should increase your heart rate and make you sweat (moderate-intensity exercise).  Do strengthening exercises at least twice a week. This is in addition to the moderate-intensity exercise.  Spend less time sitting. Even light physical activity can be beneficial. Watch cholesterol and blood lipids Have your blood tested for lipids and cholesterol at 23 years of age, then have this test every 5 years. Have your cholesterol levels checked more often if:  Your lipid or cholesterol levels are high.  You are older than 23 years of age.  You  are at high risk for heart disease. What should I know about cancer screening? Depending on your health history and family history, you may need to have cancer screening at various ages. This may include screening for:  Breast cancer.  Cervical cancer.  Colorectal cancer.  Skin cancer.  Lung cancer. What should I know about heart disease, diabetes, and high blood pressure? Blood pressure and heart disease  High blood pressure causes heart disease and increases the risk of stroke. This is more likely to develop in people who have high blood pressure readings, are of African descent, or are overweight.  Have your blood pressure checked: ? Every 3-5 years if you are 18-39 years of age. ? Every year if you are 40 years old or older. Diabetes Have regular diabetes screenings. This checks your fasting blood sugar level. Have the screening done:  Once every three years after age 40 if you are at a normal weight and have a low risk for diabetes.  More often and at a younger age if you are overweight or have a high risk for diabetes. What should I know about preventing infection? Hepatitis B If you have a higher risk for hepatitis B, you should be screened for this virus. Talk with your health care provider to find out if you are at risk for hepatitis B infection. Hepatitis C Testing is recommended for:  Everyone born from 1945 through 1965.  Anyone with known risk factors for hepatitis C. Sexually transmitted   infections (STIs)  Get screened for STIs, including gonorrhea and chlamydia, if: ? You are sexually active and are younger than 24 years of age. ? You are older than 24 years of age and your health care provider tells you that you are at risk for this type of infection. ? Your sexual activity has changed since you were last screened, and you are at increased risk for chlamydia or gonorrhea. Ask your health care provider if you are at risk.  Ask your health care provider about  whether you are at high risk for HIV. Your health care provider may recommend a prescription medicine to help prevent HIV infection. If you choose to take medicine to prevent HIV, you should first get tested for HIV. You should then be tested every 3 months for as long as you are taking the medicine. Pregnancy  If you are about to stop having your period (premenopausal) and you may become pregnant, seek counseling before you get pregnant.  Take 400 to 800 micrograms (mcg) of folic acid every day if you become pregnant.  Ask for birth control (contraception) if you want to prevent pregnancy. Osteoporosis and menopause Osteoporosis is a disease in which the bones lose minerals and strength with aging. This can result in bone fractures. If you are 65 years old or older, or if you are at risk for osteoporosis and fractures, ask your health care provider if you should:  Be screened for bone loss.  Take a calcium or vitamin D supplement to lower your risk of fractures.  Be given hormone replacement therapy (HRT) to treat symptoms of menopause. Follow these instructions at home: Lifestyle  Do not use any products that contain nicotine or tobacco, such as cigarettes, e-cigarettes, and chewing tobacco. If you need help quitting, ask your health care provider.  Do not use street drugs.  Do not share needles.  Ask your health care provider for help if you need support or information about quitting drugs. Alcohol use  Do not drink alcohol if: ? Your health care provider tells you not to drink. ? You are pregnant, may be pregnant, or are planning to become pregnant.  If you drink alcohol: ? Limit how much you use to 0-1 drink a day. ? Limit intake if you are breastfeeding.  Be aware of how much alcohol is in your drink. In the [place] one drink equals one 12 oz bottle of beer (355 mL), one 5 oz glass of wine (148 mL), or one 1 oz glass of hard liquor (44 mL). General instructions  Schedule  regular health, dental, and eye exams.  Stay current with your vaccines.  Tell your health care provider if: ? You often feel depressed. ? You have ever been abused or do not feel safe at home. Summary  Adopting a healthy lifestyle and getting preventive care are important in promoting health and wellness.  Follow your health care provider's instructions about healthy diet, exercising, and getting tested or screened for diseases.  Follow your health care provider's instructions on monitoring your cholesterol and blood pressure. This information is not intended to replace advice given to you by your health care provider. Make sure you discuss any questions you have with your health care provider. Document Revised: 12/26/2017 Document Reviewed: 12/26/2017 Elsevier Patient Education  2021 Elsevier Inc.  

## 2020-05-18 NOTE — Progress Notes (Signed)
Subjective:    Patient ID: Cindy Bates, female    DOB: 12/29/1995, 25 y.o.   MRN: [id number]   This visit occurred during the SARS-CoV-2 public health emergency.  Safety protocols were in place, including screening questions prior to the visit, additional usage of staff PPE, and extensive cleaning of exam room while observing appropriate contact time as indicated for disinfecting solutions.    HPI She is here for a physical exam.   Fatigue -   Medications and allergies reviewed with patient and updated if appropriate.  Patient Active Problem List   Diagnosis Date Noted  . Parotitis 07/25/2019  . Sinusitis 07/03/2019  . Suspected COVID-19 virus infection 12/05/2018  . BPPV (benign paroxysmal positional vertigo) 09/06/2018  . Anxiety 06/26/2018  . Sleep difficulties 06/26/2018  . Fatigue 06/21/2017  . 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 Genetic testing 03/09/2017  . 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 Asthma 02/09/2017  . 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 Family history of BRCA1 gene positive   . Family history of breast cancer   . Family history of ovarian cancer     Current Outpatient Medications on File Prior to Visit  Medication Sig Dispense Refill  . Azelastine-Fluticasone 137-50 MCG/ACT SUSP Place 1 spray into the nose 2 (two) times daily as needed (runny or stuffy nose.). 23 g 5  . clindamycin-benzoyl peroxide (BENZACLIN) gel Apply topically 2 (two) times daily. 25 g 0  . EPINEPHrine 0.3 mg/0.3 mL IJ SOAJ injection SMARTSIG:0.3 Milliliter(s) IM Once PRN    . loratadine-pseudoephedrine (CLARITIN-D 24 HOUR) 10-240 MG 24 hr tablet Take 1 tablet by mouth daily. 30 tablet 3  . Multiple Vitamin (MULTIVITAMIN WITH MINERALS) TABS tablet Take 1 tablet by mouth 2 (two) times daily.    Marland Kitchen PARoxetine (PAXIL) 20 MG tablet Take 1 tablet (20 mg total) by mouth daily. Annual appt due in June must see provider for future refills 90 tablet 0  . valACYclovir (VALTREX) 1000 MG tablet TAKE 1 TABLET BY MOUTH  TWICE A DAY 180 tablet 1   No current facility-administered medications on file prior to visit.    Past Medical History:  Diagnosis Date  . Allergy   . Asthma   . Family history of BRCA1 gene positive   . Family history of breast cancer   . Family history of ovarian cancer     Past Surgical History:  Procedure Laterality Date  . NO PAST SURGERIES      Social History   Socioeconomic History  . Marital status: Single    Spouse name: Not on file  . Number of children: Not on file  . Years of education: Not on file  . Highest education level: Not on file  Occupational History  . Not on file  Tobacco Use  . Smoking status: Never Smoker  . Smokeless tobacco: Never Used  Vaping Use  . Vaping Use: Never used  Substance and Sexual Activity  . Alcohol use: No  . Drug use: No  . Sexual activity: Not on file  Other Topics Concern  . Not on file  Social History Narrative  . Not on file   Social Determinants of Health   Financial Resource Strain: Not on file  Food Insecurity: Not on file  Transportation Needs: Not on file  Physical Activity: Not on file  Stress: Not on file  Social Connections: Not on file    Family History  Problem Relation Age of Onset  . Breast cancer Mother 87  recurred in 40's  . Hypertension Mother   . Alcohol abuse Father   . Hypertension Father   . Hypertension Maternal Grandmother   . Diabetes Maternal Grandmother   . Other Maternal Grandmother        hysterectomy in 40's  . Alcohol abuse Paternal Grandfather   . Ovarian cancer Other   . Breast cancer Other   . Allergic rhinitis Neg Hx   . Angioedema Neg Hx   . Asthma Neg Hx   . Eczema Neg Hx   . Atopy Neg Hx   . Immunodeficiency Neg Hx   . Urticaria Neg Hx     Review of Systems     Objective:  There were no vitals filed for this visit. There were no vitals filed for this visit. There is no height or weight on file to calculate BMI.  BP Readings from Last 3  Encounters:  07/25/19 110/74  07/24/19 120/86  07/03/19 120/80    Wt Readings from Last 3 Encounters:  07/25/19 132 lb 12.8 oz (60.2 kg)  07/24/19 134 lb (60.8 kg)  07/03/19 131 lb (59.4 kg)     Physical Exam Constitutional: She appears well-developed and well-nourished. No distress.  HENT:  Head: Normocephalic and atraumatic.  Right Ear: External ear normal. Normal ear canal and TM Left Ear: External ear normal.  Normal ear canal and TM Mouth/Throat: Oropharynx is clear and moist.  Eyes: Conjunctivae and EOM are normal.  Neck: Neck supple. No tracheal deviation present. No thyromegaly present.  No carotid bruit  Cardiovascular: Normal rate, regular rhythm and normal heart sounds.   No murmur heard.  No edema. Pulmonary/Chest: Effort normal and breath sounds normal. No respiratory distress. She has no wheezes. She has no rales.  Breast: deferred   Abdominal: Soft. She exhibits no distension. There is no tenderness.  Lymphadenopathy: She has no cervical adenopathy.  Skin: Skin is warm and dry. She is not diaphoretic.  Psychiatric: She has a normal mood and affect. Her behavior is normal.        Assessment & Plan:   Physical exam: Screening blood work    ordered Immunizations  Discussed HPV, covid Gyn   Exercise   Weight  normal Substance abuse  none      See Problem List for Assessment and Plan of chronic medical problems.      This encounter was created in error - please disregard.

## 2020-05-19 ENCOUNTER — Encounter: Payer: No Typology Code available for payment source | Admitting: Internal Medicine

## 2020-05-19 DIAGNOSIS — F419 Anxiety disorder, unspecified: Secondary | ICD-10-CM

## 2020-05-19 DIAGNOSIS — Z0000 Encounter for general adult medical examination without abnormal findings: Secondary | ICD-10-CM

## 2020-05-19 DIAGNOSIS — R5383 Other fatigue: Secondary | ICD-10-CM

## 2020-05-26 DIAGNOSIS — R69 Illness, unspecified: Secondary | ICD-10-CM | POA: Diagnosis not present

## 2020-06-07 DIAGNOSIS — R69 Illness, unspecified: Secondary | ICD-10-CM | POA: Diagnosis not present

## 2020-06-25 DIAGNOSIS — Z3042 Encounter for surveillance of injectable contraceptive: Secondary | ICD-10-CM | POA: Diagnosis not present

## 2020-06-30 DIAGNOSIS — R69 Illness, unspecified: Secondary | ICD-10-CM | POA: Diagnosis not present

## 2020-06-30 DIAGNOSIS — F331 Major depressive disorder, recurrent, moderate: Secondary | ICD-10-CM | POA: Diagnosis not present

## 2020-07-12 DIAGNOSIS — L089 Local infection of the skin and subcutaneous tissue, unspecified: Secondary | ICD-10-CM | POA: Diagnosis not present

## 2020-07-12 DIAGNOSIS — L658 Other specified nonscarring hair loss: Secondary | ICD-10-CM | POA: Diagnosis not present

## 2020-07-12 DIAGNOSIS — L732 Hidradenitis suppurativa: Secondary | ICD-10-CM | POA: Diagnosis not present

## 2020-07-12 DIAGNOSIS — L669 Cicatricial alopecia, unspecified: Secondary | ICD-10-CM | POA: Diagnosis not present

## 2020-07-12 DIAGNOSIS — Z79899 Other long term (current) drug therapy: Secondary | ICD-10-CM | POA: Diagnosis not present

## 2020-07-20 DIAGNOSIS — R69 Illness, unspecified: Secondary | ICD-10-CM | POA: Diagnosis not present

## 2020-07-20 DIAGNOSIS — F411 Generalized anxiety disorder: Secondary | ICD-10-CM | POA: Diagnosis not present

## 2020-07-28 DIAGNOSIS — R69 Illness, unspecified: Secondary | ICD-10-CM | POA: Diagnosis not present

## 2020-07-28 DIAGNOSIS — F331 Major depressive disorder, recurrent, moderate: Secondary | ICD-10-CM | POA: Diagnosis not present

## 2020-07-28 DIAGNOSIS — F4011 Social phobia, generalized: Secondary | ICD-10-CM | POA: Diagnosis not present

## 2020-07-30 ENCOUNTER — Other Ambulatory Visit: Payer: Self-pay

## 2020-07-30 ENCOUNTER — Ambulatory Visit (INDEPENDENT_AMBULATORY_CARE_PROVIDER_SITE_OTHER): Payer: 59 | Admitting: Internal Medicine

## 2020-07-30 ENCOUNTER — Encounter: Payer: Self-pay | Admitting: Internal Medicine

## 2020-07-30 VITALS — BP 122/88 | HR 64 | Resp 18 | Ht 62.5 in | Wt 143.8 lb

## 2020-07-30 DIAGNOSIS — K148 Other diseases of tongue: Secondary | ICD-10-CM

## 2020-07-30 MED ORDER — VALACYCLOVIR HCL 1 G PO TABS: 1000.0000 mg | ORAL_TABLET | Freq: Two times a day (BID) | ORAL | 1 refills | Status: AC

## 2020-07-30 MED ORDER — TRIAMCINOLONE ACETONIDE 0.1 % MT PSTE: 1.0000 "application " | PASTE | Freq: Two times a day (BID) | OROMUCOSAL | 0 refills | Status: DC

## 2020-07-30 NOTE — Patient Instructions (Signed)
We have refilled the valtrex and sent in a dental paste to use twice a day on the tongue.

## 2020-07-30 NOTE — Assessment & Plan Note (Signed)
Rx triamcinolone dental paste to use BID for 3-5 days.

## 2020-07-30 NOTE — Progress Notes (Signed)
   Subjective:   Patient ID: Cindy Bates, female    DOB: Jun 13, 1995, 25 y.o.   MRN: [id number]  HPI The patient is a 25 YO female coming in for bump on tongue. Thought it was HSV and took valtrex for 10 days but this did not help. Initially painful and took aleve for 3 days and now pain is gone. This seems to be decreasing in size overall but not gone.   Review of Systems  Constitutional: Negative.   HENT: Negative.    Eyes: Negative.   Respiratory:  Negative for cough, chest tightness and shortness of breath.   Cardiovascular:  Negative for chest pain, palpitations and leg swelling.  Gastrointestinal:  Negative for abdominal distention, abdominal pain, constipation, diarrhea, nausea and vomiting.  Musculoskeletal: Negative.   Skin: Negative.   Neurological: Negative.   Psychiatric/Behavioral: Negative.     Objective:  Physical Exam Constitutional:      Appearance: She is well-developed.  HENT:     Head: Normocephalic and atraumatic.     Mouth/Throat:     Comments: Small red lesion left side of tongue without discharge or ulceration Cardiovascular:     Rate and Rhythm: Normal rate and regular rhythm.  Pulmonary:     Effort: Pulmonary effort is normal. No respiratory distress.     Breath sounds: Normal breath sounds. No wheezing or rales.  Abdominal:     General: Bowel sounds are normal. There is no distension.     Palpations: Abdomen is soft.     Tenderness: There is no abdominal tenderness. There is no rebound.  Musculoskeletal:     Cervical back: Normal range of motion.  Skin:    General: Skin is warm and dry.  Neurological:     Mental Status: She is alert and oriented to person, place, and time.     Coordination: Coordination normal.    Vitals:   07/30/20 0838  BP: 122/88  Pulse: 64  Resp: 18  Weight: 143 lb 12.8 oz (65.2 kg)  Height: 5' 2.5" (1.588 m)    This visit occurred during the SARS-CoV-2 public health emergency.  Safety protocols were in place, including  screening questions prior to the visit, additional usage of staff PPE, and extensive cleaning of exam room while observing appropriate contact time as indicated for disinfecting solutions.   Assessment & Plan:

## 2020-08-03 DIAGNOSIS — F331 Major depressive disorder, recurrent, moderate: Secondary | ICD-10-CM | POA: Diagnosis not present

## 2020-08-03 DIAGNOSIS — F4011 Social phobia, generalized: Secondary | ICD-10-CM | POA: Diagnosis not present

## 2020-08-03 DIAGNOSIS — R69 Illness, unspecified: Secondary | ICD-10-CM | POA: Diagnosis not present

## 2020-08-18 DIAGNOSIS — F331 Major depressive disorder, recurrent, moderate: Secondary | ICD-10-CM | POA: Diagnosis not present

## 2020-08-18 DIAGNOSIS — R69 Illness, unspecified: Secondary | ICD-10-CM | POA: Diagnosis not present

## 2020-08-18 DIAGNOSIS — F4011 Social phobia, generalized: Secondary | ICD-10-CM | POA: Diagnosis not present

## 2020-08-24 ENCOUNTER — Encounter: Payer: 59 | Admitting: Internal Medicine

## 2020-09-08 DIAGNOSIS — R69 Illness, unspecified: Secondary | ICD-10-CM | POA: Diagnosis not present

## 2020-09-08 DIAGNOSIS — F331 Major depressive disorder, recurrent, moderate: Secondary | ICD-10-CM | POA: Diagnosis not present

## 2020-09-08 DIAGNOSIS — F4011 Social phobia, generalized: Secondary | ICD-10-CM | POA: Diagnosis not present

## 2020-09-09 DIAGNOSIS — R69 Illness, unspecified: Secondary | ICD-10-CM | POA: Diagnosis not present

## 2020-09-09 DIAGNOSIS — F411 Generalized anxiety disorder: Secondary | ICD-10-CM | POA: Diagnosis not present

## 2020-09-10 DIAGNOSIS — Z3042 Encounter for surveillance of injectable contraceptive: Secondary | ICD-10-CM | POA: Diagnosis not present

## 2020-09-29 DIAGNOSIS — F4011 Social phobia, generalized: Secondary | ICD-10-CM | POA: Diagnosis not present

## 2020-09-29 DIAGNOSIS — F331 Major depressive disorder, recurrent, moderate: Secondary | ICD-10-CM | POA: Diagnosis not present

## 2020-09-29 DIAGNOSIS — R69 Illness, unspecified: Secondary | ICD-10-CM | POA: Diagnosis not present

## 2020-10-22 DIAGNOSIS — R69 Illness, unspecified: Secondary | ICD-10-CM | POA: Diagnosis not present

## 2020-10-22 DIAGNOSIS — F411 Generalized anxiety disorder: Secondary | ICD-10-CM | POA: Diagnosis not present

## 2020-11-26 DIAGNOSIS — Z3042 Encounter for surveillance of injectable contraceptive: Secondary | ICD-10-CM | POA: Diagnosis not present

## 2020-12-02 DIAGNOSIS — F411 Generalized anxiety disorder: Secondary | ICD-10-CM | POA: Diagnosis not present

## 2020-12-02 DIAGNOSIS — R69 Illness, unspecified: Secondary | ICD-10-CM | POA: Diagnosis not present

## 2020-12-14 ENCOUNTER — Encounter: Payer: 59 | Admitting: Internal Medicine

## 2021-01-03 DIAGNOSIS — L089 Local infection of the skin and subcutaneous tissue, unspecified: Secondary | ICD-10-CM | POA: Diagnosis not present

## 2021-01-03 DIAGNOSIS — L669 Cicatricial alopecia, unspecified: Secondary | ICD-10-CM | POA: Diagnosis not present

## 2021-01-03 DIAGNOSIS — Z79899 Other long term (current) drug therapy: Secondary | ICD-10-CM | POA: Diagnosis not present

## 2021-01-03 DIAGNOSIS — L658 Other specified nonscarring hair loss: Secondary | ICD-10-CM | POA: Diagnosis not present

## 2021-01-04 DIAGNOSIS — L658 Other specified nonscarring hair loss: Secondary | ICD-10-CM | POA: Insufficient documentation

## 2021-01-21 DIAGNOSIS — R69 Illness, unspecified: Secondary | ICD-10-CM | POA: Diagnosis not present

## 2021-01-28 DIAGNOSIS — R69 Illness, unspecified: Secondary | ICD-10-CM | POA: Diagnosis not present

## 2021-01-28 DIAGNOSIS — F411 Generalized anxiety disorder: Secondary | ICD-10-CM | POA: Diagnosis not present

## 2021-02-02 DIAGNOSIS — R69 Illness, unspecified: Secondary | ICD-10-CM | POA: Diagnosis not present

## 2021-02-02 DIAGNOSIS — F909 Attention-deficit hyperactivity disorder, unspecified type: Secondary | ICD-10-CM | POA: Diagnosis not present

## 2021-02-04 DIAGNOSIS — F411 Generalized anxiety disorder: Secondary | ICD-10-CM | POA: Diagnosis not present

## 2021-02-04 DIAGNOSIS — R69 Illness, unspecified: Secondary | ICD-10-CM | POA: Diagnosis not present

## 2021-02-16 DIAGNOSIS — F909 Attention-deficit hyperactivity disorder, unspecified type: Secondary | ICD-10-CM | POA: Diagnosis not present

## 2021-02-16 DIAGNOSIS — R69 Illness, unspecified: Secondary | ICD-10-CM | POA: Diagnosis not present

## 2021-02-18 DIAGNOSIS — Z3042 Encounter for surveillance of injectable contraceptive: Secondary | ICD-10-CM | POA: Diagnosis not present

## 2021-02-22 DIAGNOSIS — J00 Acute nasopharyngitis [common cold]: Secondary | ICD-10-CM | POA: Diagnosis not present

## 2021-02-23 DIAGNOSIS — J029 Acute pharyngitis, unspecified: Secondary | ICD-10-CM | POA: Diagnosis not present

## 2021-02-23 DIAGNOSIS — Z20822 Contact with and (suspected) exposure to covid-19: Secondary | ICD-10-CM | POA: Diagnosis not present

## 2021-03-02 DIAGNOSIS — F909 Attention-deficit hyperactivity disorder, unspecified type: Secondary | ICD-10-CM | POA: Diagnosis not present

## 2021-03-02 DIAGNOSIS — R69 Illness, unspecified: Secondary | ICD-10-CM | POA: Diagnosis not present

## 2021-03-04 DIAGNOSIS — F411 Generalized anxiety disorder: Secondary | ICD-10-CM | POA: Diagnosis not present

## 2021-03-04 DIAGNOSIS — R69 Illness, unspecified: Secondary | ICD-10-CM | POA: Diagnosis not present

## 2021-03-07 ENCOUNTER — Encounter: Payer: 59 | Admitting: Internal Medicine

## 2021-03-09 DIAGNOSIS — R69 Illness, unspecified: Secondary | ICD-10-CM | POA: Diagnosis not present

## 2021-03-09 DIAGNOSIS — F909 Attention-deficit hyperactivity disorder, unspecified type: Secondary | ICD-10-CM | POA: Diagnosis not present

## 2021-03-31 ENCOUNTER — Encounter: Payer: Self-pay | Admitting: Internal Medicine

## 2021-03-31 NOTE — Progress Notes (Deleted)
? ? ?  Subjective:  ? ? Patient ID: Cindy Bates, female    DOB: 07-18-1995, 26 y.o.   MRN: [id number] ? ?This visit occurred during the SARS-CoV-2 public health emergency.  Safety protocols were in place, including screening questions prior to the visit, additional usage of staff PPE, and extensive cleaning of exam room while observing appropriate contact time as indicated for disinfecting solutions. ? ? ? ?HPI ?Cindy Bates is here for No chief complaint on file. ? ? ?STI testing  -  ? ? ? ? ?Medications and allergies reviewed with patient and updated if appropriate. ? ?Current Outpatient Medications on File Prior to Visit  ?Medication Sig Dispense Refill  ? Azelastine-Fluticasone 137-50 MCG/ACT SUSP Place 1 spray into the nose 2 (two) times daily as needed (runny or stuffy nose.). 23 g 5  ? clindamycin-benzoyl peroxide (BENZACLIN) gel Apply topically 2 (two) times daily. 25 g 0  ? EPINEPHrine 0.3 mg/0.3 mL IJ SOAJ injection SMARTSIG:0.3 Milliliter(s) IM Once PRN    ? escitalopram (LEXAPRO) 10 MG tablet Take 15 mg by mouth every morning.    ? hydrOXYzine (VISTARIL) 25 MG capsule Take 25 mg by mouth 3 (three) times daily as needed.    ? loratadine-pseudoephedrine (CLARITIN-D 24 HOUR) 10-240 MG 24 hr tablet Take 1 tablet by mouth daily. 30 tablet 3  ? Multiple Vitamin (MULTIVITAMIN WITH MINERALS) TABS tablet Take 1 tablet by mouth 2 (two) times daily.    ? triamcinolone (KENALOG) 0.1 % paste Use as directed 1 application in the mouth or throat 2 (two) times daily. 5 g 0  ? valACYclovir (VALTREX) 1000 MG tablet Take 1 tablet (1,000 mg total) by mouth 2 (two) times daily. 180 tablet 1  ? ?No current facility-administered medications on file prior to visit.  ? ? ?Review of Systems ? ?   ?Objective:  ?There were no vitals filed for this visit. ?BP Readings from Last 3 Encounters:  ?07/30/20 122/88  ?07/25/19 110/74  ?07/24/19 120/86  ? ?Wt Readings from Last 3 Encounters:  ?07/30/20 143 lb 12.8 oz (65.2 kg)  ?07/25/19 132 lb 12.8 oz  (60.2 kg)  ?07/24/19 134 lb (60.8 kg)  ? ?There is no height or weight on file to calculate BMI. ? ?  ?Physical Exam ?   ? ? ? ? ? ?Assessment & Plan:  ? ? ?See Problem List for Assessment and Plan of chronic medical problems.  ? ? ? ? ?

## 2021-04-01 ENCOUNTER — Ambulatory Visit: Payer: 59 | Admitting: Internal Medicine

## 2021-04-01 DIAGNOSIS — R69 Illness, unspecified: Secondary | ICD-10-CM | POA: Diagnosis not present

## 2021-04-01 DIAGNOSIS — F411 Generalized anxiety disorder: Secondary | ICD-10-CM | POA: Diagnosis not present

## 2021-04-07 DIAGNOSIS — Z113 Encounter for screening for infections with a predominantly sexual mode of transmission: Secondary | ICD-10-CM | POA: Insufficient documentation

## 2021-04-07 NOTE — Progress Notes (Deleted)
? ? ?  Subjective:  ? ? Patient ID: Cindy Bates, female    DOB: 25-Dec-1995, 26 y.o.   MRN: [id number] ? ?This visit occurred during the SARS-CoV-2 public health emergency.  Safety protocols were in place, including screening questions prior to the visit, additional usage of staff PPE, and extensive cleaning of exam room while observing appropriate contact time as indicated for disinfecting solutions. ? ? ? ?HPI ?Cindy Bates is here for No chief complaint on file. ? ? ?STI testing  -  ? ? ? ? ?Medications and allergies reviewed with patient and updated if appropriate. ? ?Current Outpatient Medications on File Prior to Visit  ?Medication Sig Dispense Refill  ? clindamycin-benzoyl peroxide (BENZACLIN) gel Apply topically 2 (two) times daily. 25 g 0  ? EPINEPHrine 0.3 mg/0.3 mL IJ SOAJ injection SMARTSIG:0.3 Milliliter(s) IM Once PRN    ? hydrOXYzine (VISTARIL) 25 MG capsule Take 25 mg by mouth 3 (three) times daily as needed.    ? loratadine-pseudoephedrine (CLARITIN-D 24 HOUR) 10-240 MG 24 hr tablet Take 1 tablet by mouth daily. 30 tablet 3  ? Multiple Vitamin (MULTIVITAMIN WITH MINERALS) TABS tablet Take 1 tablet by mouth 2 (two) times daily.    ? triamcinolone (KENALOG) 0.1 % paste Use as directed 1 application in the mouth or throat 2 (two) times daily. 5 g 0  ? valACYclovir (VALTREX) 1000 MG tablet Take 1 tablet (1,000 mg total) by mouth 2 (two) times daily. 180 tablet 1  ? ?No current facility-administered medications on file prior to visit.  ? ? ?Review of Systems ? ?   ?Objective:  ?There were no vitals filed for this visit. ?BP Readings from Last 3 Encounters:  ?07/30/20 122/88  ?07/25/19 110/74  ?07/24/19 120/86  ? ?Wt Readings from Last 3 Encounters:  ?07/30/20 143 lb 12.8 oz (65.2 kg)  ?07/25/19 132 lb 12.8 oz (60.2 kg)  ?07/24/19 134 lb (60.8 kg)  ? ?There is no height or weight on file to calculate BMI. ? ?  ?Physical Exam ?   ? ? ? ? ? ?Assessment & Plan:  ? ? ?See Problem List for Assessment and Plan of chronic  medical problems.  ? ? ? ? ?

## 2021-04-08 ENCOUNTER — Encounter: Payer: Self-pay | Admitting: Internal Medicine

## 2021-04-08 ENCOUNTER — Other Ambulatory Visit: Payer: Self-pay

## 2021-04-08 ENCOUNTER — Ambulatory Visit (INDEPENDENT_AMBULATORY_CARE_PROVIDER_SITE_OTHER): Payer: 59 | Admitting: Internal Medicine

## 2021-04-08 VITALS — BP 114/70 | HR 88 | Temp 98.8°F | Ht 62.5 in | Wt 166.0 lb

## 2021-04-08 DIAGNOSIS — Z833 Family history of diabetes mellitus: Secondary | ICD-10-CM | POA: Diagnosis not present

## 2021-04-08 DIAGNOSIS — Z113 Encounter for screening for infections with a predominantly sexual mode of transmission: Secondary | ICD-10-CM | POA: Diagnosis not present

## 2021-04-08 DIAGNOSIS — F419 Anxiety disorder, unspecified: Secondary | ICD-10-CM | POA: Diagnosis not present

## 2021-04-08 DIAGNOSIS — Z0000 Encounter for general adult medical examination without abnormal findings: Secondary | ICD-10-CM | POA: Diagnosis not present

## 2021-04-08 DIAGNOSIS — R69 Illness, unspecified: Secondary | ICD-10-CM | POA: Diagnosis not present

## 2021-04-08 LAB — CBC WITH DIFFERENTIAL/PLATELET
Basophils Absolute: 0 10*3/uL (ref 0.0–0.1)
Basophils Relative: 0.5 % (ref 0.0–3.0)
Eosinophils Absolute: 0.4 10*3/uL (ref 0.0–0.7)
Eosinophils Relative: 4.9 % (ref 0.0–5.0)
HCT: 39.2 % (ref 36.0–46.0)
Hemoglobin: 13 g/dL (ref 12.0–15.0)
Lymphocytes Relative: 37.4 % (ref 12.0–46.0)
Lymphs Abs: 2.9 10*3/uL (ref 0.7–4.0)
MCHC: 33.1 g/dL (ref 30.0–36.0)
MCV: 88.3 fl (ref 78.0–100.0)
Monocytes Absolute: 0.8 10*3/uL (ref 0.1–1.0)
Monocytes Relative: 10.1 % (ref 3.0–12.0)
Neutro Abs: 3.6 10*3/uL (ref 1.4–7.7)
Neutrophils Relative %: 47.1 % (ref 43.0–77.0)
Platelets: 291 10*3/uL (ref 150.0–400.0)
RBC: 4.44 Mil/uL (ref 3.87–5.11)
RDW: 13.2 % (ref 11.5–15.5)
WBC: 7.7 10*3/uL (ref 4.0–10.5)

## 2021-04-08 LAB — COMPREHENSIVE METABOLIC PANEL
ALT: 11 U/L (ref 0–35)
AST: 15 U/L (ref 0–37)
Albumin: 4.7 g/dL (ref 3.5–5.2)
Alkaline Phosphatase: 61 U/L (ref 39–117)
BUN: 13 mg/dL (ref 6–23)
CO2: 26 mEq/L (ref 19–32)
Calcium: 10 mg/dL (ref 8.4–10.5)
Chloride: 103 mEq/L (ref 96–112)
Creatinine, Ser: 0.83 mg/dL (ref 0.40–1.20)
GFR: 99.05 mL/min (ref >60.00)
Glucose, Bld: 81 mg/dL (ref 70–99)
Potassium: 4 mEq/L (ref 3.5–5.1)
Sodium: 137 mEq/L (ref 135–145)
Total Bilirubin: 0.4 mg/dL (ref 0.2–1.2)
Total Protein: 7.8 g/dL (ref 6.0–8.3)

## 2021-04-08 LAB — LIPID PANEL
Cholesterol: 127 mg/dL (ref 0–200)
HDL: 41 mg/dL (ref >39.00)
LDL Cholesterol: 67 mg/dL (ref 0–99)
NonHDL: 85.58
Total CHOL/HDL Ratio: 3
Triglycerides: 93 mg/dL (ref 0.0–149.0)
VLDL: 18.6 mg/dL (ref 0.0–40.0)

## 2021-04-08 LAB — HEMOGLOBIN A1C: Hgb A1c MFr Bld: 5.8 % (ref 4.6–6.5)

## 2021-04-08 NOTE — Patient Instructions (Addendum)
? ? ? ?Blood work was ordered.   ? ? ?Medications changes include :   none ? ? ?Return in about 1 year (around 04/09/2022) for CPE. ? ? ?Health Maintenance, Female ?Adopting a healthy lifestyle and getting preventive care are important in promoting health and wellness. Ask your health care provider about: ?The right schedule for you to have regular tests and exams. ?Things you can do on your own to prevent diseases and keep yourself healthy. ?What should I know about diet, weight, and exercise? ?Eat a healthy diet ? ?Eat a diet that includes plenty of vegetables, fruits, low-fat dairy products, and lean protein. ?Do not eat a lot of foods that are high in solid fats, added sugars, or sodium. ?Maintain a healthy weight ?Body mass index (BMI) is used to identify weight problems. It estimates body fat based on height and weight. Your health care provider can help determine your BMI and help you achieve or maintain a healthy weight. ?Get regular exercise ?Get regular exercise. This is one of the most important things you can do for your health. Most adults should: ?Exercise for at least 150 minutes each week. The exercise should increase your heart rate and make you sweat (moderate-intensity exercise). ?Do strengthening exercises at least twice a week. This is in addition to the moderate-intensity exercise. ?Spend less time sitting. Even light physical activity can be beneficial. ?Watch cholesterol and blood lipids ?Have your blood tested for lipids and cholesterol at 24 years of age, then have this test every 5 years. ?Have your cholesterol levels checked more often if: ?Your lipid or cholesterol levels are high. ?You are older than 24 years of age. ?You are at high risk for heart disease. ?What should I know about cancer screening? ?Depending on your health history and family history, you may need to have cancer screening at various ages. This may include screening for: ?Breast cancer. ?Cervical cancer. ?Colorectal  cancer. ?Skin cancer. ?Lung cancer. ?What should I know about heart disease, diabetes, and high blood pressure? ?Blood pressure and heart disease ?High blood pressure causes heart disease and increases the risk of stroke. This is more likely to develop in people who have high blood pressure readings or are overweight. ?Have your blood pressure checked: ?Every 3-5 years if you are 66-44 years of age. ?Every year if you are 22 years old or older. ?Diabetes ?Have regular diabetes screenings. This checks your fasting blood sugar level. Have the screening done: ?Once every three years after age 60 if you are at a normal weight and have a low risk for diabetes. ?More often and at a younger age if you are overweight or have a high risk for diabetes. ?What should I know about preventing infection? ?Hepatitis B ?If you have a higher risk for hepatitis B, you should be screened for this virus. Talk with your health care provider to find out if you are at risk for hepatitis B infection. ?Hepatitis C ?Testing is recommended for: ?Everyone born from [birth year] through 1963. ?Anyone with known risk factors for hepatitis C. ?Sexually transmitted infections (STIs) ?Get screened for STIs, including gonorrhea and chlamydia, if: ?You are sexually active and are younger than 24 years of age. ?You are older than 24 years of age and your health care provider tells you that you are at risk for this type of infection. ?Your sexual activity has changed since you were last screened, and you are at increased risk for chlamydia or gonorrhea. Ask your health care provider if  you are at risk. ?Ask your health care provider about whether you are at high risk for HIV. Your health care provider may recommend a prescription medicine to help prevent HIV infection. If you choose to take medicine to prevent HIV, you should first get tested for HIV. You should then be tested every 3 months for as long as you are taking the medicine. ?Pregnancy ?If you are  about to stop having your period (premenopausal) and you may become pregnant, seek counseling before you get pregnant. ?Take 400 to 800 micrograms (mcg) of folic acid every day if you become pregnant. ?Ask for birth control (contraception) if you want to prevent pregnancy. ?Osteoporosis and menopause ?Osteoporosis is a disease in which the bones lose minerals and strength with aging. This can result in bone fractures. If you are 70 years old or older, or if you are at risk for osteoporosis and fractures, ask your health care provider if you should: ?Be screened for bone loss. ?Take a calcium or vitamin D supplement to lower your risk of fractures. ?Be given hormone replacement therapy (HRT) to treat symptoms of menopause. ?Follow these instructions at home: ?Alcohol use ?Do not drink alcohol if: ?Your health care provider tells you not to drink. ?You are pregnant, may be pregnant, or are planning to become pregnant. ?If you drink alcohol: ?Limit how much you have to: ?0-1 drink a day. ?Know how much alcohol is in your drink. In the [place] one drink equals one 12 oz bottle of beer (355 mL), one 5 oz glass of wine (148 mL), or one 1? oz glass of hard liquor (44 mL). ?Lifestyle ?Do not use any products that contain nicotine or tobacco. These products include cigarettes, chewing tobacco, and vaping devices, such as e-cigarettes. If you need help quitting, ask your health care provider. ?Do not use street drugs. ?Do not share needles. ?Ask your health care provider for help if you need support or information about quitting drugs. ?General instructions ?Schedule regular health, dental, and eye exams. ?Stay current with your vaccines. ?Tell your health care provider if: ?You often feel depressed. ?You have ever been abused or do not feel safe at home. ?Summary ?Adopting a healthy lifestyle and getting preventive care are important in promoting health and wellness. ?Follow your health care provider's instructions about  healthy diet, exercising, and getting tested or screened for diseases. ?Follow your health care provider's instructions on monitoring your cholesterol and blood pressure. ?This information is not intended to replace advice given to you by your health care provider. Make sure you discuss any questions you have with your health care provider. ?Document Revised: 05/24/2020 Document Reviewed: 05/24/2020 ?Elsevier Patient Education ? [place]. ? ?

## 2021-04-08 NOTE — Assessment & Plan Note (Signed)
Screening for stds ?

## 2021-04-08 NOTE — Progress Notes (Signed)
? ? ?Subjective:  ? ? Patient ID: Cindy Bates, female    DOB: November 25, 1995, 26 y.o.   MRN: [id number] ? ? ?This visit occurred during the SARS-CoV-2 public health emergency.  Safety protocols were in place, including screening questions prior to the visit, additional usage of staff PPE, and extensive cleaning of exam room while observing appropriate contact time as indicated for disinfecting solutions. ? ? ? ?HPI ?Adore is here for  ?Chief Complaint  ?Patient presents with  ? Follow-up  ?  STD/HIV screening and follow up  ? Annual Exam  ? ? ? ?Medications and allergies reviewed with patient and updated if appropriate. ? ? ? ?Current Outpatient Medications on File Prior to Visit  ?Medication Sig Dispense Refill  ? busPIRone (BUSPAR) 10 MG tablet Take 10 mg by mouth daily.    ? clindamycin-benzoyl peroxide (BENZACLIN) gel Apply topically 2 (two) times daily. 25 g 0  ? EPINEPHrine 0.3 mg/0.3 mL IJ SOAJ injection SMARTSIG:0.3 Milliliter(s) IM Once PRN    ? escitalopram (LEXAPRO) 20 MG tablet Take 20 mg by mouth every morning.    ? hydrOXYzine (VISTARIL) 25 MG capsule Take 25 mg by mouth 3 (three) times daily as needed.    ? loratadine-pseudoephedrine (CLARITIN-D 24 HOUR) 10-240 MG 24 hr tablet Take 1 tablet by mouth daily. 30 tablet 3  ? medroxyPROGESTERone Acetate 150 MG/ML SUSY SMARTSIG:1 Milliliter(s) IM Every 12 Weeks    ? methylphenidate 27 MG PO CR tablet Take 27 mg by mouth daily.    ? Multiple Vitamin (MULTIVITAMIN WITH MINERALS) TABS tablet Take 1 tablet by mouth 2 (two) times daily.    ? propranolol (INDERAL) 10 MG tablet Take 10 mg by mouth daily.    ? valACYclovir (VALTREX) 1000 MG tablet Take 1 tablet (1,000 mg total) by mouth 2 (two) times daily. 180 tablet 1  ? ?No current facility-administered medications on file prior to visit.  ? ? ?Review of Systems  ?Constitutional:  Negative for fever.  ?Eyes:  Negative for visual disturbance.  ?Respiratory:  Negative for cough, shortness of breath and wheezing.    ?Cardiovascular:  Negative for chest pain, palpitations and leg swelling.  ?Gastrointestinal:  Negative for abdominal pain, blood in stool, constipation, diarrhea and nausea.  ?     No gerd  ?Genitourinary:  Negative for dysuria.  ?Musculoskeletal:  Negative for arthralgias and back pain.  ?Skin:  Negative for rash.  ?Neurological:  Negative for light-headedness and headaches.  ?Psychiatric/Behavioral:  Negative for dysphoric mood. The patient is nervous/anxious.   ? ?   ?Objective:  ? ?Vitals:  ? 04/08/21 1407  ?BP: 114/70  ?Pulse: 88  ?Temp: 98.8 ?F (37.1 ?C)  ?SpO2: 98%  ? ?Filed Weights  ? 04/08/21 1407  ?Weight: 166 lb (75.3 kg)  ? ?Body mass index is 29.88 kg/m?. ? ?BP Readings from Last 3 Encounters:  ?04/08/21 114/70  ?07/30/20 122/88  ?07/25/19 110/74  ? ? ?Wt Readings from Last 3 Encounters:  ?04/08/21 166 lb (75.3 kg)  ?07/30/20 143 lb 12.8 oz (65.2 kg)  ?07/25/19 132 lb 12.8 oz (60.2 kg)  ? ? ? ?  07/25/2019  ?  2:41 PM 06/26/2018  ?  2:19 PM 02/09/2017  ?  9:36 AM 11/11/2015  ?  2:27 PM  ?Depression screen PHQ 2/9  ?Decreased Interest 0 0 0 0  ?Down, Depressed, Hopeless 0 0 0 0  ?PHQ - 2 Score 0 0 0 0  ? ? ? ?   ? View :  No data to display.  ?  ?  ?  ? ? ? ? ?  ?Physical Exam ?Constitutional: She appears well-developed and well-nourished. No distress.  ?HENT:  ?Head: Normocephalic and atraumatic.  ?Right Ear: External ear normal. Normal ear canal and TM ?Left Ear: External ear normal.  Normal ear canal and TM ?Mouth/Throat: Oropharynx is clear and moist.  ?Eyes: Conjunctivae and EOM are normal.  ?Neck: Neck supple. No tracheal deviation present. No thyromegaly present.  ?No carotid bruit  ?Cardiovascular: Normal rate, regular rhythm and normal heart sounds.   ?No murmur heard.  No edema. ?Pulmonary/Chest: Effort normal and breath sounds normal. No respiratory distress. She has no wheezes. She has no rales.  ?Breast: deferred   ?Abdominal: Soft. She exhibits no distension. There is no tenderness.   ?Lymphadenopathy: She has no cervical adenopathy.  ?Skin: Skin is warm and dry. She is not diaphoretic.  ?Psychiatric: She has a normal mood and affect. Her behavior is normal.  ? ? ? ?Lab Results  ?Component Value Date  ? WBC 6.0 01/13/2018  ? HGB 12.5 01/13/2018  ? HCT 38.3 01/13/2018  ? PLT 218 01/13/2018  ? GLUCOSE 97 01/13/2018  ? ALT 9 06/21/2017  ? AST 11 06/21/2017  ? NA 140 01/13/2018  ? K 3.4 (L) 01/13/2018  ? CL 109 01/13/2018  ? CREATININE 0.66 01/13/2018  ? BUN 6 01/13/2018  ? CO2 22 01/13/2018  ? TSH 1.15 06/21/2017  ? ? ? ? ?   ?Assessment & Plan:  ? ?Physical exam: ?Screening blood work  ordered ?Exercise  not regular - encouraged regular exercise ?Weight  overweight ?Substance abuse  none ? ? ?Reviewed recommended immunizations. ? ? ?Health Maintenance  ?Topic Date Due  ? HPV VACCINES (1 - 2-dose series) Never done  ? HIV Screening  Never done  ? Hepatitis C Screening  Never done  ? PAP-Cervical Cytology Screening  Never done  ? PAP SMEAR-Modifier  Never done  ? COVID-19 Vaccine (3 - Booster for Moderna series) 04/24/2021 (Originally 07/01/2019)  ? TETANUS/TDAP  07/16/2025  ? INFLUENZA VACCINE  Completed  ?  ? ? ? ? ? ? ?See Problem List for Assessment and Plan of chronic medical problems. ? ? ? ? ? ?

## 2021-04-08 NOTE — Assessment & Plan Note (Signed)
Chronic ?management per psychiatry ? ? ?

## 2021-04-08 NOTE — Assessment & Plan Note (Signed)
a1c

## 2021-04-11 LAB — HEPATITIS C ANTIBODY
Hepatitis C Ab: NONREACTIVE
SIGNAL TO CUT-OFF: 0.05 (ref <1.00)

## 2021-04-11 LAB — RPR: RPR Ser Ql: NONREACTIVE

## 2021-04-11 LAB — HSV 1 ANTIBODY, IGG: HSV 1 Glycoprotein G Ab, IgG: >58 index — ABNORMAL HIGH

## 2021-04-11 LAB — HIV ANTIBODY (ROUTINE TESTING W REFLEX): HIV 1&2 Ab, 4th Generation: NONREACTIVE

## 2021-04-11 LAB — HSV 2 ANTIBODY, IGG: HSV 2 Glycoprotein G Ab, IgG: <0.9 index

## 2021-04-12 ENCOUNTER — Encounter: Payer: Self-pay | Admitting: Internal Medicine

## 2021-04-12 DIAGNOSIS — R7303 Prediabetes: Secondary | ICD-10-CM | POA: Insufficient documentation

## 2021-04-12 LAB — TSH: TSH: 0.46 u[IU]/mL (ref 0.35–5.50)

## 2021-04-13 DIAGNOSIS — R69 Illness, unspecified: Secondary | ICD-10-CM | POA: Diagnosis not present

## 2021-04-13 DIAGNOSIS — F909 Attention-deficit hyperactivity disorder, unspecified type: Secondary | ICD-10-CM | POA: Diagnosis not present

## 2021-04-27 DIAGNOSIS — K2900 Acute gastritis without bleeding: Secondary | ICD-10-CM | POA: Diagnosis not present

## 2021-05-05 ENCOUNTER — Ambulatory Visit: Payer: 59 | Admitting: Internal Medicine

## 2021-05-06 DIAGNOSIS — Z3042 Encounter for surveillance of injectable contraceptive: Secondary | ICD-10-CM | POA: Diagnosis not present

## 2021-05-19 DIAGNOSIS — R69 Illness, unspecified: Secondary | ICD-10-CM | POA: Diagnosis not present

## 2021-05-19 DIAGNOSIS — F909 Attention-deficit hyperactivity disorder, unspecified type: Secondary | ICD-10-CM | POA: Diagnosis not present

## 2021-05-27 DIAGNOSIS — F331 Major depressive disorder, recurrent, moderate: Secondary | ICD-10-CM | POA: Diagnosis not present

## 2021-05-27 DIAGNOSIS — R69 Illness, unspecified: Secondary | ICD-10-CM | POA: Diagnosis not present

## 2021-05-27 DIAGNOSIS — F411 Generalized anxiety disorder: Secondary | ICD-10-CM | POA: Diagnosis not present

## 2021-06-02 DIAGNOSIS — R69 Illness, unspecified: Secondary | ICD-10-CM | POA: Diagnosis not present

## 2021-06-02 DIAGNOSIS — F4011 Social phobia, generalized: Secondary | ICD-10-CM | POA: Diagnosis not present

## 2021-06-02 DIAGNOSIS — F900 Attention-deficit hyperactivity disorder, predominantly inattentive type: Secondary | ICD-10-CM | POA: Diagnosis not present

## 2021-06-06 NOTE — Progress Notes (Signed)
    Subjective:    Patient ID: Cindy Bates, female    DOB: 07/18/1995, 26 y.o.   MRN: [id number]      HPI Cindy Bates is here for  Chief Complaint  Patient presents with   Sore Throat   Wheezing         Medications and allergies reviewed with patient and updated if appropriate.  Current Outpatient Medications on File Prior to Visit  Medication Sig Dispense Refill   busPIRone (BUSPAR) 10 MG tablet Take 10 mg by mouth daily.     clindamycin-benzoyl peroxide (BENZACLIN) gel Apply topically 2 (two) times daily. 25 g 0   EPINEPHrine 0.3 mg/0.3 mL IJ SOAJ injection SMARTSIG:0.3 Milliliter(s) IM Once PRN     escitalopram (LEXAPRO) 20 MG tablet Take 20 mg by mouth every morning.     hydrOXYzine (VISTARIL) 25 MG capsule Take 25 mg by mouth 3 (three) times daily as needed.     loratadine-pseudoephedrine (CLARITIN-D 24 HOUR) 10-240 MG 24 hr tablet Take 1 tablet by mouth daily. 30 tablet 3   medroxyPROGESTERone Acetate 150 MG/ML SUSY SMARTSIG:1 Milliliter(s) IM Every 12 Weeks     methylphenidate 27 MG PO CR tablet Take 27 mg by mouth daily.     Multiple Vitamin (MULTIVITAMIN WITH MINERALS) TABS tablet Take 1 tablet by mouth 2 (two) times daily.     propranolol (INDERAL) 10 MG tablet Take 10 mg by mouth daily.     valACYclovir (VALTREX) 1000 MG tablet Take 1 tablet (1,000 mg total) by mouth 2 (two) times daily. 180 tablet 1   No current facility-administered medications on file prior to visit.    Review of Systems     Objective:  There were no vitals filed for this visit. BP Readings from Last 3 Encounters:  04/08/21 114/70  07/30/20 122/88  07/25/19 110/74   Wt Readings from Last 3 Encounters:  04/08/21 166 lb (75.3 kg)  07/30/20 143 lb 12.8 oz (65.2 kg)  07/25/19 132 lb 12.8 oz (60.2 kg)   There is no height or weight on file to calculate BMI.    Physical Exam         Assessment & Plan:    See Problem List for Assessment and Plan of chronic medical problems.      This encounter was created in error - please disregard.

## 2021-06-07 ENCOUNTER — Encounter: Payer: 59 | Admitting: Internal Medicine

## 2021-06-11 NOTE — Patient Instructions (Signed)
     Blood work was ordered.     Medications changes include :   none   Your prescription(s) have been sent to your pharmacy.    A referral was ordered for [facility] for a colonoscopy.     Someone from that office will call you to schedule an appointment.    Return in about 6 months (around 02/16/2022) for Physical Exam.   [facility] Phone: [phone]  

## 2021-06-28 ENCOUNTER — Telehealth: Payer: 59 | Admitting: Physician Assistant

## 2021-06-28 DIAGNOSIS — W57XXXA Bitten or stung by nonvenomous insect and other nonvenomous arthropods, initial encounter: Secondary | ICD-10-CM

## 2021-06-28 DIAGNOSIS — S90861A Insect bite (nonvenomous), right foot, initial encounter: Secondary | ICD-10-CM | POA: Diagnosis not present

## 2021-06-28 MED ORDER — TRIAMCINOLONE ACETONIDE 0.025 % EX OINT: 1.0000 "application " | TOPICAL_OINTMENT | Freq: Two times a day (BID) | CUTANEOUS | 0 refills | Status: AC

## 2021-06-28 NOTE — Progress Notes (Signed)
E-Visit for Insect Sting  Thank you for describing the insect sting for [place].  Here is how we plan to help!  Based on what you have shared with me you have a healing sting that is causing some significant itching itself but without swelling or pain. This can be due to healing itself but oftentimes is due to a milder delayed sensitivity to the bite itself. I recommend taking an OTC antihistamine like benadryl (can make you sleepy). With this I am send in a prescription steroid ointment to apply to the area.   The 2 greatest risks from insect stings are allergic reaction, which can be fatal in some people and infection, which is more common and less serious.  Bees, wasps, yellow jackets, and hornets belong to a class of insects called Hymenoptera.  Most insect stings cause only minor discomfort.  Stings can happen anywhere on the body and can be painful.  Most stings are from honey bees or yellow jackets.  Fire ants can sting multiple times.  The sites of the stings are more likely to become infected.     What can be used to prevent Insect Stings?  Insect repellant with at least 20% DEET.  Wearing long pants and shirts with socks and shoes.  Wear dark or drab-colored clothes rather than bright colors.  Avoid using perfumes and hair sprays; these attract insects.  HOME CARE ADVICE:  1. Stinger removal: The stinger looks like a tiny black dot in the sting. Use a fingernail, credit card edge, or knife-edge to scrape it off.  Don't pull it out because it squeezes out more venom. If the stinger is below the skin surface, leave it alone.  It will be shed with normal skin healing. 2. Use cold compresses to the area of the sting for 10-20 minutes.  You may repeat this as needed to relieve symptoms of pain and swelling. 3.  For pain relief, take acetominophen 650 mg 4-6 hours as needed or ibuprofen 400 mg every 6-8 hours as needed or naproxen 250-500 mg every 12 hours as needed. 4.  You can also use  hydrocortisone cream 0.5% or 1% up to 4 times daily as needed for itching. 5.  If the sting becomes very itchy, take Benadryl 25-50 mg, follow directions on box. 6.  Wash the area 2-3 times daily with antibacterial soap and warm water. 7. Call your Doctor if: Fever, a severe headache, or rash occur in the next 2 weeks. Sting area begins to look infected. Redness and swelling worsens after home treatment. Your current symptoms become worse.    MAKE SURE YOU:  Understand these instructions. Will watch your condition. Will get help right away if you are not doing well or get worse.  Thank you for choosing an e-visit.  Your e-visit answers were reviewed by a board certified advanced clinical practitioner to complete your personal care plan. Depending upon the condition, your plan could have included both over the counter or prescription medications.  Please review your pharmacy choice. Make sure the pharmacy is open so you can pick up prescription now. If there is a problem, you may contact your provider through [employer] and have the prescription routed to another pharmacy.  Your safety is important to [place]. If you have drug allergies check your prescription carefully.   For the next 24 hours you can use MyChart to ask questions about today's visit, request a non-urgent call back, or ask for a work or school excuse. You  will get an email in the next two days asking about your experience. I hope that your e-visit has been valuable and will speed your recovery.

## 2021-06-28 NOTE — Progress Notes (Signed)
I have spent 5 minutes in review of e-visit questionnaire, review and updating patient chart, medical decision making and response to patient.   Ranger Petrich Cody Rilley Stash, PA-C    

## 2021-07-24 ENCOUNTER — Other Ambulatory Visit: Payer: Self-pay | Admitting: Physician Assistant

## 2021-07-25 MED ORDER — CLINDAMYCIN PHOS-BENZOYL PEROX 1-5 % EX GEL
Freq: Two times a day (BID) | CUTANEOUS | 0 refills | Status: DC
Start: 2021-07-25 — End: 2021-08-12

## 2021-07-28 ENCOUNTER — Ambulatory Visit (INDEPENDENT_AMBULATORY_CARE_PROVIDER_SITE_OTHER): Payer: Self-pay | Admitting: Internal Medicine

## 2021-07-28 ENCOUNTER — Encounter: Payer: Self-pay | Admitting: Internal Medicine

## 2021-07-28 DIAGNOSIS — M79604 Pain in right leg: Secondary | ICD-10-CM | POA: Insufficient documentation

## 2021-07-28 MED ORDER — PREDNISONE 20 MG PO TABS: 40.0000 mg | ORAL_TABLET | Freq: Every day | ORAL | 0 refills | Status: DC

## 2021-07-28 MED ORDER — CYCLOBENZAPRINE HCL 5 MG PO TABS: 5.0000 mg | ORAL_TABLET | Freq: Three times a day (TID) | ORAL | 1 refills | Status: DC | PRN

## 2021-07-28 NOTE — Assessment & Plan Note (Signed)
Suspect strain. She is not on estrogen containing birth control. She is a non-smoker and no recent travel making DVT unlikely (also no swelling or tenderness to palpation). Rx flexeril qhs for pain and prednisone 5 day course. Continue to use ibuprofen and tylenol for pain otc prn. If no improvement in 3-4 days she will contact us and we can consider doppler.

## 2021-07-28 NOTE — Patient Instructions (Signed)
We have sent in flexeril to use as a muscle relaxer. We have sent in prednisone which is the steroid to take 2 pills a day for 5 days.  You can take tylenol and ibuprofen in the meantime.

## 2021-07-28 NOTE — Progress Notes (Signed)
   Subjective:   Patient ID: Cindy Bates, female    DOB: 06-12-1995, 26 y.o.   MRN: [id number]  HPI The patient is a 26 YO female coming in for right calf pain.  Review of Systems  Constitutional: Negative.   HENT: Negative.    Eyes: Negative.   Respiratory:  Negative for cough, chest tightness and shortness of breath.   Cardiovascular:  Negative for chest pain, palpitations and leg swelling.  Gastrointestinal:  Negative for abdominal distention, abdominal pain, constipation, diarrhea, nausea and vomiting.  Musculoskeletal:  Positive for myalgias.  Skin: Negative.   Neurological: Negative.   Psychiatric/Behavioral: Negative.      Objective:  Physical Exam Constitutional:      Appearance: She is well-developed.  HENT:     Head: Normocephalic and atraumatic.  Cardiovascular:     Rate and Rhythm: Normal rate and regular rhythm.  Pulmonary:     Effort: Pulmonary effort is normal. No respiratory distress.     Breath sounds: Normal breath sounds. No wheezing or rales.  Abdominal:     General: Bowel sounds are normal. There is no distension.     Palpations: Abdomen is soft.     Tenderness: There is no abdominal tenderness. There is no rebound.  Musculoskeletal:        General: Tenderness present.     Cervical back: Normal range of motion.     Right lower leg: No edema.     Left lower leg: No edema.     Comments: No calf swelling left or right and equal size. No extreme tenderness to right calf. Right ankle lateral tenderness and some general leg tenderness.   Skin:    General: Skin is warm and dry.  Neurological:     Mental Status: She is alert and oriented to person, place, and time.     Coordination: Coordination normal.     Vitals:   07/28/21 0926  BP: 116/78  Pulse: (!) 106  Resp: 18  SpO2: 98%  Weight: 164 lb 6.4 oz (74.6 kg)  Height: 5' 2.5" (1.588 m)    Assessment & Plan:

## 2021-08-07 ENCOUNTER — Encounter: Payer: Self-pay | Admitting: Internal Medicine

## 2021-08-09 ENCOUNTER — Encounter: Payer: Self-pay | Admitting: Internal Medicine

## 2021-08-09 NOTE — Telephone Encounter (Signed)
Saw Dr. Okey Dupre 07/28/21 for problem.. no better was inform send to PCP.Marland KitchenRaechel Bates

## 2021-08-11 ENCOUNTER — Emergency Department (HOSPITAL_BASED_OUTPATIENT_CLINIC_OR_DEPARTMENT_OTHER): Payer: No Typology Code available for payment source

## 2021-08-11 ENCOUNTER — Emergency Department (HOSPITAL_COMMUNITY)
Admission: EM | Admit: 2021-08-11 | Discharge: 2021-08-11 | Disposition: A | Discharge status: Home / Self Care | Payer: No Typology Code available for payment source | Attending: Emergency Medicine | Admitting: Emergency Medicine

## 2021-08-11 ENCOUNTER — Emergency Department (HOSPITAL_COMMUNITY): Payer: No Typology Code available for payment source

## 2021-08-11 ENCOUNTER — Encounter (HOSPITAL_COMMUNITY): Payer: Self-pay

## 2021-08-11 ENCOUNTER — Other Ambulatory Visit: Payer: Self-pay

## 2021-08-11 DIAGNOSIS — M79604 Pain in right leg: Secondary | ICD-10-CM

## 2021-08-11 DIAGNOSIS — M7989 Other specified soft tissue disorders: Secondary | ICD-10-CM | POA: Insufficient documentation

## 2021-08-11 DIAGNOSIS — M79661 Pain in right lower leg: Secondary | ICD-10-CM

## 2021-08-11 DIAGNOSIS — R000 Tachycardia, unspecified: Secondary | ICD-10-CM | POA: Insufficient documentation

## 2021-08-11 NOTE — Discharge Instructions (Addendum)
You do not have evidence of a blood clot in your leg.  You will need to get help right away if you have onset of severe chest pain or shortness of breath or you lose consciousness.  Otherwise please follow-up with orthopedics for further evaluation.  Elevate and rest her leg.  Get help right away for any new or concerning symptoms.

## 2021-08-11 NOTE — ED Triage Notes (Signed)
Patient c/o right leg pain x 2 weeks. Patient states she saw her PCP and was prescribed a steroid and a muscle relaxant. Patient states swelling of the right calf. Patient states she has slight tongue swelling and swelling to the right neck since 1000. Patient denies any breathing or swallowing issues.

## 2021-08-11 NOTE — ED Provider Triage Note (Signed)
Emergency Medicine Provider Triage Evaluation Note  Cindy Bates , a 26 y.o. female  was evaluated in triage.  Pt complains of right lower leg swelling. She states that same has been present for 2-3 weeks. States she originally went to her PCP for same and was prescribed muscle relaxer and steroids which she states gave her some improvement. However she states that since she ran out of these meds her symptoms have returned. Denies hx of blood clot, recent travel, recent surgery, hx of malignancy. Sent here with concern for DVT  Also, she states that around 10 am this morning she noticed some mild swelling in her bilateral neck and tongue. States that same is very mild in nature and is not getting any worse. She is able to breath and swallow normally. Denies any new food or other exposures. No itching. No ace inhibitors. States she is really only concerned for DVT.  Review of Systems  Positive:  Negative:   Physical Exam  BP 131/62 (BP Location: Left Arm)   Pulse (!) 123   Temp 98.8 F (37.1 C) (Oral)   Resp 16   Ht 5' 2.5" (1.588 m)   Wt 75.8 kg   LMP 05/16/2020 (Exact Date)   SpO2 100%   BMI 30.06 kg/m  Gen:   Awake, no distress   Resp:  Normal effort, airway patent without any obvious signs of swelling MSK:   Moves extremities without difficulty  Other:  Trace RLE swelling and calf tenderness without erythema or palpable cord. DP and PT pulses intact and 2+  Medical Decision Making  Medically screening exam initiated at 12:47 PM.  Appropriate orders placed.  Cindy Bates was informed that the remainder of the evaluation will be completed by another provider, this initial triage assessment does not replace that evaluation, and the importance of remaining in the ED until their evaluation is complete.     Silva Bandy, PA-C 08/11/21 1252

## 2021-08-11 NOTE — Progress Notes (Signed)
RLE venous duplex has been completed.  Preliminary results given to Lurena Nida, PA-C.   Results can be found under chart review under CV PROC. 08/11/2021 3:40 PM Raylea Adcox RVT, RDMS

## 2021-08-11 NOTE — ED Provider Notes (Signed)
[facility]-EMERGENCY DEPT Provider Note   CSN: [id number] Arrival date & time: 08/11/21  1152  History  Chief Complaint  Patient presents with   Leg Pain   neck swelling   Oral Swelling    Cindy Bates is a 26 y.o. female who presents emergency department chief complaint of right leg swelling.  Patient complains of pain in her calf for the past 2 to 3 weeks.  She is placed on prednisone and a muscle relaxer by her PCP.  It was doing better but then when she stopped it begins swelling.  She denies fever, chills, chest pain, shortness of breath.  She does not have any significant knee pain.  It also felt like today she had some swelling on the right side of her neck and tongue.  She is not denying any trouble swallowing.  She denies fevers, chills, tick bites, soaking night sweats, unexplained weight loss while loss, abnormal lymphadenopathy.  26 year old female who presents with right leg pain and swelling.   Leg Pain      Home Medications Prior to Admission medications   Medication Sig Start Date End Date Taking? Authorizing Provider  cyclobenzaprine (FLEXERIL) 5 MG tablet Take 1 tablet (5 mg total) by mouth 3 (three) times daily as needed for muscle spasms. 08/12/21   Myrlene Broker, MD  EPINEPHrine 0.3 mg/0.3 mL IJ SOAJ injection SMARTSIG:0.3 Milliliter(s) IM Once PRN 07/24/19   [provider]  loratadine-pseudoephedrine (CLARITIN-D 24 HOUR) 10-240 MG 24 hr tablet Take 1 tablet by mouth daily. 08/20/18   Pincus Sanes, MD  medroxyPROGESTERone Acetate 150 MG/ML SUSY SMARTSIG:1 Milliliter(s) IM Every 12 Weeks 02/07/21   [provider]  meloxicam (MOBIC) 15 MG tablet Take 1 tablet (15 mg total) by mouth daily. 08/12/21   Myrlene Broker, MD  Multiple Vitamin (MULTIVITAMIN WITH MINERALS) TABS tablet Take 1 tablet by mouth 2 (two) times daily.    [provider]  triamcinolone (KENALOG) 0.025 % ointment Apply 1 application   topically 2 (two) times daily. 06/28/21   Waldon Merl, PA-C  valACYclovir (VALTREX) 1000 MG tablet Take 1 tablet (1,000 mg total) by mouth 2 (two) times daily. 07/30/20   Myrlene Broker, MD      Allergies    Apple cider vinegar, Apple juice, Peanut (diagnostic), and Pear    Review of Systems   Review of Systems  Physical Exam Updated Vital Signs BP 128/84   Pulse 93   Temp 98.7 F (37.1 C) (Oral)   Resp 16   Ht 5' 2.5" (1.588 m)   Wt 75.8 kg   LMP 05/16/2020 (Exact Date)   SpO2 100%   BMI 30.06 kg/m  Physical Exam Vitals and nursing note reviewed.  Constitutional:      General: She is not in acute distress.    Appearance: She is well-developed. She is not diaphoretic.  HENT:     Head: Normocephalic and atraumatic.     Right Ear: External ear normal.     Left Ear: External ear normal.     Nose: Nose normal.     Mouth/Throat:     Mouth: Mucous membranes are moist.  Eyes:     General: No scleral icterus.    Conjunctiva/sclera: Conjunctivae normal.  Cardiovascular:     Rate and Rhythm: Normal rate and regular rhythm.     Heart sounds: Normal heart sounds. No murmur heard.    No friction rub. No gallop.  Pulmonary:  Effort: Pulmonary effort is normal. No respiratory distress.     Breath sounds: Normal breath sounds.  Abdominal:     General: Bowel sounds are normal. There is no distension.     Palpations: Abdomen is soft. There is no mass.     Tenderness: There is no abdominal tenderness. There is no guarding.  Musculoskeletal:     Cervical back: Normal range of motion.     Comments: Mild global swelling of the left leg.  Compartments are tender, tender along the Achilles tendon.  No obvious effusion in the knee or ankle  Lymphadenopathy:     Comments: No groin, cervical, axillary, supraclavicular palpable adenopathy.  No obvious swelling or tenderness to the neck or tongue on exam.  Skin:    General: Skin is warm and dry.  Neurological:     Mental  Status: She is alert and oriented to person, place, and time.  Psychiatric:        Behavior: Behavior normal.     ED Results / Procedures / Treatments   Labs (all labs ordered are listed, but only abnormal results are displayed) Labs Reviewed - No data to display  EKG None  Radiology VAS [place] LOWER EXTREMITY VENOUS (DVT) (7a-7p)  Result Date: 08/12/2021  Lower Venous DVT Study Patient Name:  Cindy Bates  Date of Exam:   08/11/2021 Medical Rec #: [id number]   Accession #:    [id number] Date of Birth: April 07, 1995    Patient Gender: F Patient Age:   55 years Exam Location:  [facility] Procedure:      VAS [place] LOWER EXTREMITY VENOUS (DVT) Referring Phys: [facility] SMOOT --------------------------------------------------------------------------------  Indications: Pain, and Swelling.  Comparison Study: No previous exams Performing Technologist: Jody Hill RVT, RDMS  Examination Guidelines: A complete evaluation includes B-mode imaging, spectral Doppler, color Doppler, and power Doppler as needed of all accessible portions of each vessel. Bilateral testing is considered an integral part of a complete examination. Limited examinations for reoccurring indications may be performed as noted. The reflux portion of the exam is performed with the patient in reverse Trendelenburg.  +--------+---------------+---------+-----------+----------+--------------------+ RIGHT   CompressibilityPhasicitySpontaneityPropertiesThrombus Aging       +--------+---------------+---------+-----------+----------+--------------------+ CFV     Full           Yes      Yes                                       +--------+---------------+---------+-----------+----------+--------------------+ SFJ     Full                                                              +--------+---------------+---------+-----------+----------+--------------------+ FV Prox Full           Yes      Yes                                        +--------+---------------+---------+-----------+----------+--------------------+ FV Mid  Full           Yes      Yes                                       +--------+---------------+---------+-----------+----------+--------------------+  FV      Full           Yes      Yes                                       Distal                                                                    +--------+---------------+---------+-----------+----------+--------------------+ PFV                    Yes      Yes                  patent by                                                                 color/doppler        +--------+---------------+---------+-----------+----------+--------------------+ POP     Full           Yes      Yes                                       +--------+---------------+---------+-----------+----------+--------------------+ PTV     Full                                                              +--------+---------------+---------+-----------+----------+--------------------+ PERO    Full                                                              +--------+---------------+---------+-----------+----------+--------------------+   +----+---------------+---------+-----------+----------+--------------+ LEFTCompressibilityPhasicitySpontaneityPropertiesThrombus Aging +----+---------------+---------+-----------+----------+--------------+ CFV Full           Yes      Yes                                 +----+---------------+---------+-----------+----------+--------------+     Summary: RIGHT: - There is no evidence of deep vein thrombosis in the lower extremity.  - A cystic structure is found in the popliteal fossa (3.37 x 1.21 x 2.35). - Ultrasound characteristics of slightly enlarged lymph nodes are noted in the groin.  LEFT: - No evidence of common femoral vein obstruction. - Ultrasound characteristics of slightly enlarged lymph nodes noted  in the groin.  *See table(s) above for measurements and observations. Electronically signed by Heath Lark on 08/12/2021 at 2:00:19 PM.    Final  DG Knee Complete 4 Views Right  Result Date: 08/11/2021 CLINICAL DATA:  Right knee pain, swelling EXAM: RIGHT KNEE - COMPLETE 4+ VIEW COMPARISON:  None Available. FINDINGS: No evidence of fracture, dislocation, or joint effusion. No evidence of arthropathy or other focal bone abnormality. Soft tissues are unremarkable. IMPRESSION: Negative. Electronically Signed   By: Charlett Nose M.D.   On: 08/11/2021 16:07    Procedures Procedures    Medications Ordered in ED Medications - No data to display  ED Course/ Medical Decision Making/ A&P Clinical Course as of 08/12/21 1819  Thu Aug 11, 2021  1613 VAS [place] LOWER EXTREMITY VENOUS (DVT) (7a-7p) [AH]  1613 DG Knee Complete 4 Views Right I visualized and interpreted both vascular lower extremity DVT and a right knee x-ray.  No acute findings noted. [AH]    Clinical Course User Index [AH] Arthor Captain, PA-C                           Medical Decision Making 26 year old female who presents the emergency department with right leg pain and swelling. Differential diagnosis includes lymphedema, , cellulitis, abscess, venous insufficiency, Baker's cyst, joint effusion. Patient denies any chest pain or shortness of breath.  She was notably tachycardic when she came in but states that this was normal for her because she is extremely nervous.  She has been sitting for some time and heart rate has normalized.  I ordered and visualized images of a 4 view right knee which shows no joint effusion or other abnormality. I visualized and reviewed as well as interpreted a right lower extremity venous Doppler ultrasound which shows no evidence of DVT.  Unsure of the current etiology of the patient's leg swelling.  Going to have the patient follow-up with orthopedics and her primary care physician.  She denies any active  chest pain or shortness of breath and I have discussed outpatient follow-up and return precautions.  She appears otherwise appropriate for discharge at this time and is currently pain-free in her leg.  Amount and/or Complexity of Data Reviewed Radiology: ordered. Decision-making details documented in ED Course.           Final Clinical Impression(s) / ED Diagnoses Final diagnoses:  Right leg swelling  Right calf pain    Rx / DC Orders ED Discharge Orders     None         Arthor Captain, PA-C 08/12/21 1819    Pricilla Loveless, MD 08/15/21 2215

## 2021-08-12 ENCOUNTER — Encounter: Payer: Self-pay | Admitting: Internal Medicine

## 2021-08-12 ENCOUNTER — Ambulatory Visit (INDEPENDENT_AMBULATORY_CARE_PROVIDER_SITE_OTHER): Payer: No Typology Code available for payment source | Admitting: Internal Medicine

## 2021-08-12 DIAGNOSIS — M79604 Pain in right leg: Secondary | ICD-10-CM | POA: Diagnosis not present

## 2021-08-12 DIAGNOSIS — M7121 Synovial cyst of popliteal space [Baker], right knee: Secondary | ICD-10-CM

## 2021-08-12 MED ORDER — CYCLOBENZAPRINE HCL 5 MG PO TABS
5.0000 mg | ORAL_TABLET | Freq: Three times a day (TID) | ORAL | 1 refills | Status: DC | PRN
Start: 2021-08-12 — End: 2021-09-15

## 2021-08-12 MED ORDER — MELOXICAM 15 MG PO TABS: 15.0000 mg | ORAL_TABLET | Freq: Every day | ORAL | 0 refills | Status: DC

## 2021-08-12 NOTE — Assessment & Plan Note (Signed)
Discussed new findings from ER of baker's cyst which fits clinically with her pain and location. She is asked to stop otc nsaids and prescribed meloxicam 15 mg daily for up to 1 month while this resolves. Suspect small tear in baker's cyst with pain and swelling in the upper calf region. Advised to use ice on the area. If not resolving she can call orthopedics and we discussed they could drain and inject steroids to this area if not resolving. Typically these resolve without intervention in 4-6 weeks.

## 2021-08-12 NOTE — Patient Instructions (Signed)
We have sent in meloxicam to take 1 pill daily for the next 2-4 weeks while the baker's cyst heals.  We have also refilled the muscle relaxer if you need that.   It is okay to take tylenol along with the meloxicam.

## 2021-08-12 NOTE — Progress Notes (Signed)
   Subjective:   Patient ID: Cindy Bates, female    DOB: 1995/07/20, 26 y.o.   MRN: [id number]  HPI The patient is a 26 YO female coming in for right knee/leg pain. Went to ER yesterday (notes not available for review, US done negative for DVT positive for baker's cyst). Took prednisone after last visit which helped eliminate the pain however it returned after she stopped. Also ran out of muscle relaxers. Now having pain with position okay with walking.   Review of Systems  Constitutional: Negative.   HENT: Negative.    Eyes: Negative.   Respiratory:  Negative for cough, chest tightness and shortness of breath.   Cardiovascular:  Negative for chest pain, palpitations and leg swelling.  Gastrointestinal:  Negative for abdominal distention, abdominal pain, constipation, diarrhea, nausea and vomiting.  Musculoskeletal:  Positive for arthralgias and myalgias.  Skin: Negative.   Neurological: Negative.   Psychiatric/Behavioral: Negative.      Objective:  Physical Exam Constitutional:      Appearance: She is well-developed.  HENT:     Head: Normocephalic and atraumatic.  Cardiovascular:     Rate and Rhythm: Normal rate and regular rhythm.  Pulmonary:     Effort: Pulmonary effort is normal. No respiratory distress.     Breath sounds: Normal breath sounds. No wheezing or rales.  Abdominal:     General: Bowel sounds are normal. There is no distension.     Palpations: Abdomen is soft.     Tenderness: There is no abdominal tenderness. There is no rebound.  Musculoskeletal:        General: Tenderness present.     Cervical back: Normal range of motion.     Comments: Pain right posterior knee and upper right calf.   Skin:    General: Skin is warm and dry.  Neurological:     Mental Status: She is alert and oriented to person, place, and time.     Coordination: Coordination normal.     Vitals:   08/12/21 0804  BP: 122/80  Pulse: 85  Resp: 18  SpO2: 99%  Weight: 168 lb 12.8 oz (76.6  kg)  Height: 5' 2.5" (1.588 m)    Assessment & Plan:  Visit time 20 minutes in face to face communication with patient and coordination of care, additional 10 minutes spent in record review, coordination or care, ordering tests, communicating/referring to other healthcare professionals, documenting in medical records all on the same day of the visit for total time 30 minutes spent on the visit.

## 2021-08-12 NOTE — Assessment & Plan Note (Signed)
No ER note to review however reviewed [place] ruling out DVT as well as x-ray knee. She did not have trauma so not surprising x-ray was unrevealing. Baker's cyst present on [place] which is likely source of pain. Advised NSAIDs, ice and this should resolve in 4-6 weeks. Rx meloxicam to take 15 mg daily for up to 4 weeks and do not take otc NSAIDs while taking this. Okay to use tylenol for pain otc. Refilled cyclobenzaprine as this was effective for pain previously up to TID prn 5 mg.

## 2021-08-15 ENCOUNTER — Ambulatory Visit: Payer: Self-pay | Admitting: Internal Medicine

## 2021-08-23 ENCOUNTER — Other Ambulatory Visit: Payer: Self-pay | Admitting: Internal Medicine

## 2021-09-07 ENCOUNTER — Encounter: Payer: No Typology Code available for payment source | Admitting: Internal Medicine

## 2021-09-08 ENCOUNTER — Other Ambulatory Visit: Payer: Self-pay | Admitting: Internal Medicine

## 2021-09-15 NOTE — Progress Notes (Signed)
Subjective:    Patient ID: Cindy Bates, female    DOB: 1995/10/03, 26 y.o.   MRN: [id number]      HPI [name] is here for  Chief Complaint  Patient presents with   Fatigue    Sleeping a lot during the day and fatigued.  Would like ADD evaluation.    Recently she has been snoring - her mom told her.  She is having joint pain - pain in her left thumb - worse with typing and ben hand back  ? ADD - she was on medication - methylphenidate from January - May of this year.  She was also on lexapro.  Prescribed by psych Dr Tomasa Hose.  She was depressed in may after her dog died and stopped the medication - she thinks now she should not have stopped it.  It was helping.  She was never really tested for it and wonders if she has it.   She does have difficulty focusing and concentrating.  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  She drifts in what she does.  It affects her studying.    She does feel a little depressed - it comes and goes.  She does feel anxious.    She does feel fatigued. Gets about 6 hrs of sleep at night. Her sleep quality is good. She is not currently exercising.  Eating good.     Knee pain recently  - b/l.  Has bakers cyst in rights.  L wrist, sometimes left.    No pain in feet, fingers, ankles, shoulders, elbows, back.  No muscle pain.  Medications and allergies reviewed with patient and updated if appropriate.  Current Outpatient Medications on File Prior to Visit  Medication Sig Dispense Refill   EPINEPHrine 0.3 mg/0.3 mL IJ SOAJ injection SMARTSIG:0.3 Milliliter(s) IM Once PRN     loratadine-pseudoephedrine (CLARITIN-D 24 HOUR) 10-240 MG 24 hr tablet Take 1 tablet by mouth daily. 30 tablet 3   medroxyPROGESTERone Acetate 150 MG/ML SUSY SMARTSIG:1 Milliliter(s) IM Every 12 Weeks     Multiple Vitamin (MULTIVITAMIN WITH MINERALS) TABS tablet Take 1 tablet by mouth 2 (two) times daily.     triamcinolone (KENALOG) 0.025 % ointment Apply 1 application  topically 2 (two)  times daily. 30 g 0   valACYclovir (VALTREX) 1000 MG tablet Take 1 tablet (1,000 mg total) by mouth 2 (two) times daily. 180 tablet 1   No current facility-administered medications on file prior to visit.    Review of Systems  Constitutional:  Positive for fatigue. Negative for appetite change.  Respiratory:  Positive for wheezing (recently - allergies). Negative for cough and shortness of breath.   Cardiovascular:  Negative for chest pain, palpitations and leg swelling.  Musculoskeletal:  Positive for arthralgias.  Neurological:  Negative for light-headedness and headaches.  Psychiatric/Behavioral:  Negative for sleep disturbance (gets 6 hrs).        Objective:   Vitals:   09/16/21 1008  BP: 110/82  Pulse: 70  Resp: 16  Temp: 98.5 F (36.9 C)   BP Readings from Last 3 Encounters:  09/16/21 110/82  08/12/21 122/80  08/11/21 128/84   Wt Readings from Last 3 Encounters:  09/16/21 167 lb (75.8 kg)  08/12/21 168 lb 12.8 oz (76.6 kg)  08/11/21 167 lb (75.8 kg)   Body mass index is 30.06 kg/m.    Physical Exam Constitutional:      General: She is not in acute distress.  Appearance: Normal appearance.  HENT:     Head: Normocephalic and atraumatic.  Eyes:     Conjunctiva/sclera: Conjunctivae normal.  Cardiovascular:     Rate and Rhythm: Normal rate and regular rhythm.     Heart sounds: Normal heart sounds. No murmur heard. Pulmonary:     Effort: Pulmonary effort is normal. No respiratory distress.     Breath sounds: Normal breath sounds. No wheezing.  Musculoskeletal:     Cervical back: Neck supple.     Right lower leg: No edema.     Left lower leg: No edema.  Lymphadenopathy:     Cervical: No cervical adenopathy.  Skin:    General: Skin is warm and dry.     Findings: No rash.  Neurological:     Mental Status: She is alert. Mental status is at baseline.  Psychiatric:        Mood and Affect: Mood normal.        Behavior: Behavior normal.             Assessment & Plan:    See Problem List for Assessment and Plan of chronic medical problems.

## 2021-09-15 NOTE — Patient Instructions (Addendum)
    Work on increasing sleep.     Medications changes include :  lexapro 10 mg daily    Your prescription(s) have been sent to your pharmacy.    A referral was ordered for behavioral health - psychology.     Someone from that office will call you to schedule an appointment.    Return in about 6 months (around 03/17/2022) for follow up.

## 2021-09-16 ENCOUNTER — Encounter: Payer: Self-pay | Admitting: Internal Medicine

## 2021-09-16 ENCOUNTER — Ambulatory Visit (INDEPENDENT_AMBULATORY_CARE_PROVIDER_SITE_OTHER): Payer: No Typology Code available for payment source | Admitting: Internal Medicine

## 2021-09-16 VITALS — BP 110/82 | HR 70 | Temp 98.5°F | Resp 16 | Ht 62.5 in | Wt 167.0 lb

## 2021-09-16 DIAGNOSIS — R5383 Other fatigue: Secondary | ICD-10-CM | POA: Diagnosis not present

## 2021-09-16 DIAGNOSIS — F419 Anxiety disorder, unspecified: Secondary | ICD-10-CM

## 2021-09-16 DIAGNOSIS — R41840 Attention and concentration deficit: Secondary | ICD-10-CM | POA: Diagnosis not present

## 2021-09-16 DIAGNOSIS — M25561 Pain in right knee: Secondary | ICD-10-CM | POA: Diagnosis not present

## 2021-09-16 DIAGNOSIS — M25531 Pain in right wrist: Secondary | ICD-10-CM | POA: Diagnosis not present

## 2021-09-16 DIAGNOSIS — F32A Depression, unspecified: Secondary | ICD-10-CM

## 2021-09-16 DIAGNOSIS — M25539 Pain in unspecified wrist: Secondary | ICD-10-CM | POA: Insufficient documentation

## 2021-09-16 DIAGNOSIS — G8929 Other chronic pain: Secondary | ICD-10-CM

## 2021-09-16 DIAGNOSIS — M25562 Pain in left knee: Secondary | ICD-10-CM

## 2021-09-16 DIAGNOSIS — M25532 Pain in left wrist: Secondary | ICD-10-CM

## 2021-09-16 MED ORDER — ESCITALOPRAM OXALATE 10 MG PO TABS: 10.0000 mg | ORAL_TABLET | Freq: Every day | ORAL | 1 refills | Status: DC

## 2021-09-16 NOTE — Assessment & Plan Note (Signed)
New Left wrist much more than right wrist Sounds like tendinitis or possible early carpal tunnel syndrome Advised a wrist brace at night Advised her to reevaluate her ergonomics-typing makes this worse Can refer to sports medicine if there is no improvement

## 2021-09-16 NOTE — Assessment & Plan Note (Signed)
She is experiencing some mild depression and anxiety She was on Lexapro earlier this year and she felt like it really helped, but she thinks she can go without it We will go ahead and start Lexapro 10 mg daily-it may be be helpful for her this year and there is a lot of stress Also help with some of her attention and focus given underlying anxiety Start Lexapro 10 mg daily Follow-up in 6 months, sooner if needed

## 2021-09-16 NOTE — Assessment & Plan Note (Signed)
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  Probably sounds good.  She did mention that she is snoring, but that is a new thing and likely situational-do not have a strong suspicion for OSA Blood work earlier this year was all normal Encouraged her to try to do 30 minutes of exercise a day Taking a multivitamin daily She deferred further blood work at this time We will try to work on getting more sleep and Possibly exercising

## 2021-09-16 NOTE — Assessment & Plan Note (Signed)
Chronic States bilateral knee pain Discussed this could be related to muscle weakness surrounding her knees Discussed that we can refer her to sports medicine for further evaluation-deferred today and will let me know if she wants referral in the future

## 2021-09-16 NOTE — Assessment & Plan Note (Signed)
Chronic She states difficulty concentrating She did see a psychiatrist earlier this year and was started on methylphenidate-she states she was not officially tested for ADD and would like to be tested.  She thinks she would benefit from medication, but would like to be tested Referral to behavioral health for formal testing of ADD

## 2021-09-20 NOTE — Telephone Encounter (Signed)
original prescription was discontinued on 09/15/2021 by Pincus Sanes, MD

## 2021-12-14 ENCOUNTER — Ambulatory Visit: Payer: No Typology Code available for payment source | Admitting: Psychology

## 2021-12-23 ENCOUNTER — Ambulatory Visit: Payer: No Typology Code available for payment source | Admitting: Psychology

## 2022-01-28 ENCOUNTER — Encounter (INDEPENDENT_AMBULATORY_CARE_PROVIDER_SITE_OTHER): Payer: No Typology Code available for payment source | Admitting: Internal Medicine

## 2022-01-28 DIAGNOSIS — F419 Anxiety disorder, unspecified: Secondary | ICD-10-CM

## 2022-01-31 DIAGNOSIS — F419 Anxiety disorder, unspecified: Secondary | ICD-10-CM | POA: Diagnosis not present

## 2022-01-31 DIAGNOSIS — F32A Depression, unspecified: Secondary | ICD-10-CM | POA: Diagnosis not present

## 2022-01-31 MED ORDER — ESCITALOPRAM OXALATE 20 MG PO TABS: 20.0000 mg | ORAL_TABLET | Freq: Every day | ORAL | 0 refills | Status: DC

## 2022-01-31 NOTE — Telephone Encounter (Signed)

## 2022-03-06 ENCOUNTER — Ambulatory Visit: Payer: No Typology Code available for payment source | Admitting: Internal Medicine

## 2022-03-07 ENCOUNTER — Encounter: Payer: Self-pay | Admitting: Internal Medicine

## 2022-03-07 NOTE — Patient Instructions (Signed)
      Blood work was ordered.   The lab is on the first floor.    Medications changes include :   none    A referral was ordered for [place] neurology.     Someone will call you to schedule an appointment.     Return in about 6 months (around 09/06/2022) for Physical Exam.

## 2022-03-07 NOTE — Progress Notes (Unsigned)
    Subjective:    Patient ID: Cindy Bates, female    DOB: May 09, 1995, 27 y.o.   MRN: [id number]      HPI Cindy Bates is here for No chief complaint on file.    Sugar -  last A1c 5.8%  Depression -   ADD - referred to behavioral health - had appt 12/14/21   ? OSA    Medications and allergies reviewed with patient and updated if appropriate.  Current Outpatient Medications on File Prior to Visit  Medication Sig Dispense Refill   EPINEPHrine 0.3 mg/0.3 mL IJ SOAJ injection SMARTSIG:0.3 Milliliter(s) IM Once PRN     escitalopram (LEXAPRO) 20 MG tablet Take 1 tablet (20 mg total) by mouth daily. 90 tablet 0   loratadine-pseudoephedrine (CLARITIN-D 24 HOUR) 10-240 MG 24 hr tablet Take 1 tablet by mouth daily. 30 tablet 3   medroxyPROGESTERone Acetate 150 MG/ML SUSY SMARTSIG:1 Milliliter(s) IM Every 12 Weeks     Multiple Vitamin (MULTIVITAMIN WITH MINERALS) TABS tablet Take 1 tablet by mouth 2 (two) times daily.     triamcinolone (KENALOG) 0.025 % ointment Apply 1 application  topically 2 (two) times daily. 30 g 0   valACYclovir (VALTREX) 1000 MG tablet Take 1 tablet (1,000 mg total) by mouth 2 (two) times daily. 180 tablet 1   No current facility-administered medications on file prior to visit.    Review of Systems     Objective:  There were no vitals filed for this visit. BP Readings from Last 3 Encounters:  09/16/21 110/82  08/12/21 122/80  08/11/21 128/84   Wt Readings from Last 3 Encounters:  09/16/21 167 lb (75.8 kg)  08/12/21 168 lb 12.8 oz (76.6 kg)  08/11/21 167 lb (75.8 kg)   There is no height or weight on file to calculate BMI.    Physical Exam         Assessment & Plan:    See Problem List for Assessment and Plan of chronic medical problems.

## 2022-03-08 ENCOUNTER — Ambulatory Visit: Payer: No Typology Code available for payment source | Admitting: Psychology

## 2022-03-08 ENCOUNTER — Ambulatory Visit (INDEPENDENT_AMBULATORY_CARE_PROVIDER_SITE_OTHER): Payer: No Typology Code available for payment source | Admitting: Internal Medicine

## 2022-03-08 ENCOUNTER — Encounter: Payer: Self-pay | Admitting: Internal Medicine

## 2022-03-08 VITALS — BP 120/74 | Temp 98.5°F | Ht 62.5 in | Wt 169.0 lb

## 2022-03-08 DIAGNOSIS — R7303 Prediabetes: Secondary | ICD-10-CM

## 2022-03-08 DIAGNOSIS — F32A Depression, unspecified: Secondary | ICD-10-CM | POA: Diagnosis not present

## 2022-03-08 DIAGNOSIS — F419 Anxiety disorder, unspecified: Secondary | ICD-10-CM | POA: Diagnosis not present

## 2022-03-08 DIAGNOSIS — R0683 Snoring: Secondary | ICD-10-CM | POA: Diagnosis not present

## 2022-03-08 LAB — HEMOGLOBIN A1C: Hgb A1c MFr Bld: 5.7 % (ref 4.6–6.5)

## 2022-03-08 NOTE — Assessment & Plan Note (Signed)
Chronic Check a1c, CMP Low sugar / carb diet Stressed regular exercise Currently she has a very unhealthy lifestyle which is related to school-realistically she probably will not be able to make big changes until she is done with school and studying for the bar

## 2022-03-08 NOTE — Assessment & Plan Note (Addendum)
Chronic Improved with the increased dose of lexapro  Continue lexapro 20 mg daily Encourage regular exercise She is a tough few months ahead of her

## 2022-03-08 NOTE — Assessment & Plan Note (Signed)
Chronic Per her mom she is snoring Her mom has sleep apnea and is concerned that she has it or not Of course there is some fatigue which is likely related to being in school We both agree we should have this further evaluated just to be on the safe side Referral to neurology for further evaluation of sleep apnea

## 2022-03-09 LAB — COMPREHENSIVE METABOLIC PANEL
ALT: 8 U/L (ref 0–35)
AST: 12 U/L (ref 0–37)
Albumin: 4.9 g/dL (ref 3.5–5.2)
Alkaline Phosphatase: 60 U/L (ref 39–117)
BUN: 11 mg/dL (ref 6–23)
CO2: 23 mEq/L (ref 19–32)
Calcium: 10.5 mg/dL (ref 8.4–10.5)
Chloride: 106 mEq/L (ref 96–112)
Creatinine, Ser: 0.77 mg/dL (ref 0.40–1.20)
GFR: 107.69 mL/min (ref >60.00)
Glucose, Bld: 108 mg/dL — ABNORMAL HIGH (ref 70–99)
Potassium: 4.1 mEq/L (ref 3.5–5.1)
Sodium: 139 mEq/L (ref 135–145)
Total Bilirubin: 0.3 mg/dL (ref 0.2–1.2)
Total Protein: 8.4 g/dL — ABNORMAL HIGH (ref 6.0–8.3)

## 2022-03-15 ENCOUNTER — Ambulatory Visit: Payer: No Typology Code available for payment source | Admitting: Psychology

## 2022-04-19 ENCOUNTER — Encounter: Payer: Self-pay | Admitting: Neurology

## 2022-04-19 ENCOUNTER — Ambulatory Visit: Payer: No Typology Code available for payment source | Admitting: Neurology

## 2022-04-19 VITALS — BP 137/69 | HR 97 | Ht 63.0 in | Wt 169.0 lb

## 2022-04-19 DIAGNOSIS — R519 Headache, unspecified: Secondary | ICD-10-CM | POA: Diagnosis not present

## 2022-04-19 DIAGNOSIS — J309 Allergic rhinitis, unspecified: Secondary | ICD-10-CM | POA: Diagnosis not present

## 2022-04-19 DIAGNOSIS — R351 Nocturia: Secondary | ICD-10-CM | POA: Insufficient documentation

## 2022-04-19 DIAGNOSIS — R0683 Snoring: Secondary | ICD-10-CM | POA: Diagnosis not present

## 2022-04-19 DIAGNOSIS — G478 Other sleep disorders: Secondary | ICD-10-CM | POA: Insufficient documentation

## 2022-04-19 NOTE — Progress Notes (Signed)
SLEEP MEDICINE CLINIC    Provider:  Larey Seat, MD  Primary Care Physician:  Binnie Rail, MD [place] [address]     Referring Provider: Binnie Rail, Md [address] [place],  [place] [address]          Chief Complaint according to patient   Patient presents with:     New Patient (Initial Visit)     NEW SLEEP Patient in room #1Referred by PCP Burns. . Patient states her mother witnessed that she snores at night and she doesn't sleep well at night. She wakes up unrefreshed and with Headaches       HISTORY OF PRESENT ILLNESS:  Cindy Bates is a 27 y.o. female patient who is seen upon referral on 04/19/2022 .  Chief concern according to patient :  NEW SLEEP Patient in room #1Referred by PCP Burns. . Patient states her mother witnessed that she snores at night and she doesn't sleep well at night. She wakes up unrefreshed and with dull Headaches.  Rarely did a headache wake her, she has nocturia 2-3 times.     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   Sleep relevant medical history: Nocturia 2-3 times , no  Sleep walking,  no Tonsillectomy no concussions, TBI, no cervical spine injury.  Takes Claritin D every day, allergic to dog and pollen.    Family medical /sleep history: mother on CPAP with OSA, Turner Daniels) , no insomnia, no sleep walkers.    Social history:  Patient is a Oceanographer, drinks a lot of coffee- feels sleepy in daytime and can't easily sleep at night.  she lives in a household with her dog,  alone.  Tobacco use; none.  ETOH use ; when going out-   Caffeine intake in form of Coffee( 6-8) Soda( /) Tea ( /) or energy drinks Exercise in form of  none-  .     Sleep habits are as follows: The patient's dinner time is between 7-8 PM. The patient goes to bed at midnight- 12 PM and continues to sleep for 6 hours, wakes for 2-3 bathroom breaks, the first  time at 2-3 AM.   The preferred sleep position is laterally, with the support of 2 pillows.  Dreams are reportedly  frequent..   The patient wakes up  with an alarm. She sets 5 alarms- starting at 7.30 !!! Hits snooze until she finally is late-  8.45 AM is the usual rise time. Has every morning headaches - throbbing in the forehead , pressure behind the eye She reports not feeling refreshed or restored in AM,  not since age 27- 70- with symptoms such as dry mouth, morning headaches, and residual fatigue. She is always up to check the door, to listen to the sunds of the night.  Bedroom is cool, but TV is on until 1 AM-  sleep timer. Its interrupting her sleep, light and sounds.  Naps are taken frequently,  every day - lasting from 45 to 60 minutes and are more refreshing than nocturnal sleep.     Review of Systems: Out of a complete 14 system review, the patient complains of only the following symptoms, and all other reviewed systems are negative.:  Fatigue, sleepiness , snoring, fragmented sleep,  Insomnia, no RLS  Nocturia    How likely are  you to doze in the following situations: 0 = not likely, 1 = slight chance, 2 = moderate chance, 3 = high chance   Sitting and Reading? Watching Television? Sitting inactive in a public place (theater or meeting)? As a passenger in a car for an hour without a break? Lying down in the afternoon when circumstances permit? Sitting and talking to someone? Sitting quietly after lunch without alcohol? In a car, while stopped for a few minutes in traffic?   Total = 17/ 24 points   FSS endorsed at 50/ 63 points..    Social History   Socioeconomic History   Marital status: Single    Spouse name: Not on file   Number of children: Not on file   Years of education: Not on file   Highest education level: Not on file  Occupational History   Not on file  Tobacco Use   Smoking status: Never   Smokeless tobacco: Never  Vaping Use   Vaping Use: Never  used  Substance and Sexual Activity   Alcohol use: No   Drug use: No   Sexual activity: Not on file  Other Topics Concern   Not on file  Social History Narrative   Not on file   Social Determinants of Health   Financial Resource Strain: Not on file  Food Insecurity: Not on file  Transportation Needs: Not on file  Physical Activity: Not on file  Stress: Not on file  Social Connections: Not on file    Family History  Problem Relation Age of Onset   Breast cancer Mother 46       recurred in 94's   Hypertension Mother    Alcohol abuse Father    Hypertension Father    Hypertension Maternal Grandmother    Diabetes Maternal Grandmother    Other Maternal Grandmother        hysterectomy in 22's   Alcohol abuse Paternal Grandfather    Ovarian cancer Other    Breast cancer Other    Allergic rhinitis Neg Hx    Angioedema Neg Hx    Asthma Neg Hx    Eczema Neg Hx    Atopy Neg Hx    Immunodeficiency Neg Hx    Urticaria Neg Hx     Past Medical History:  Diagnosis Date   Allergy    Asthma    Family history of BRCA1 gene positive    Family history of breast cancer    Family history of ovarian cancer     Past Surgical History:  Procedure Laterality Date   NO PAST SURGERIES       Current Outpatient Medications on File Prior to Visit  Medication Sig Dispense Refill   EPINEPHrine 0.3 mg/0.3 mL IJ SOAJ injection SMARTSIG:0.3 Milliliter(s) IM Once PRN     escitalopram (LEXAPRO) 20 MG tablet Take 1 tablet (20 mg total) by mouth daily. 90 tablet 0   loratadine-pseudoephedrine (CLARITIN-D 24 HOUR) 10-240 MG 24 hr tablet Take 1 tablet by mouth daily. 30 tablet 3   medroxyPROGESTERone Acetate 150 MG/ML SUSY SMARTSIG:1 Milliliter(s) IM Every 12 Weeks     Multiple Vitamin (MULTIVITAMIN WITH MINERALS) TABS tablet Take 1 tablet by mouth 2 (two) times daily.     triamcinolone (KENALOG) 0.025 % ointment Apply 1 application  topically 2 (two) times daily. 30 g 0   valACYclovir (VALTREX)  1000 MG tablet Take 1 tablet (1,000 mg total) by mouth 2 (two) times daily. 180 tablet 1   No current facility-administered  medications on file prior to visit.    Allergies  Allergen Reactions   Apple Cider Vinegar Anaphylaxis   Apple Juice Anaphylaxis, Itching, Shortness Of Breath and Swelling   Peanut (Diagnostic) Anaphylaxis and Shortness Of Breath   Pear Anaphylaxis     DIAGNOSTIC DATA (LABS, IMAGING, TESTING) - I reviewed patient records, labs, notes, testing and imaging myself where available.  Lab Results  Component Value Date   WBC 7.7 04/08/2021   HGB 13.0 04/08/2021   HCT 39.2 04/08/2021   MCV 88.3 04/08/2021   PLT 291.0 04/08/2021      Component Value Date/Time   NA 139 03/08/2022 1404   K 4.1 03/08/2022 1404   CL 106 03/08/2022 1404   CO2 23 03/08/2022 1404   GLUCOSE 108 (H) 03/08/2022 1404   BUN 11 03/08/2022 1404   CREATININE 0.77 03/08/2022 1404   CALCIUM 10.5 03/08/2022 1404   PROT 8.4 (H) 03/08/2022 1404   ALBUMIN 4.9 03/08/2022 1404   AST 12 03/08/2022 1404   ALT 8 03/08/2022 1404   ALKPHOS 60 03/08/2022 1404   BILITOT 0.3 03/08/2022 1404   GFRNONAA >60 01/13/2018 0648   GFRAA >60 01/13/2018 0648   Lab Results  Component Value Date   CHOL 127 04/08/2021   HDL 41.00 04/08/2021   LDLCALC 67 04/08/2021   TRIG 93.0 04/08/2021   CHOLHDL 3 04/08/2021   Lab Results  Component Value Date   HGBA1C 5.7 03/08/2022   No results found for: "VITAMINB12" Lab Results  Component Value Date   TSH 0.46 04/08/2021    PHYSICAL EXAM:  Today's Vitals   04/19/22 1529  BP: 137/69  Pulse: 97  Weight: 169 lb (76.7 kg)  Height: 5\' 3"  (1.6 m)   Body mass index is 29.94 kg/m.   Wt Readings from Last 3 Encounters:  04/19/22 169 lb (76.7 kg)  03/08/22 169 lb (76.7 kg)  09/16/21 167 lb (75.8 kg)     Ht Readings from Last 3 Encounters:  04/19/22 5\' 3"  (1.6 m)  03/08/22 5' 2.5" (1.588 m)  09/16/21 5' 2.5" (1.588 m)      General: The patient is  awake, alert and appears not in acute distress. The patient is well groomed. Head: Normocephalic, atraumatic. Neck is supple.  Mallampati 1,  neck circumference:15. 75 inches . Nasal airflow is patent.  Retrognathia is not seen.  Dental status: intact  Cardiovascular:  Regular rate and cardiac rhythm by pulse,  without distended neck veins. Respiratory: Lungs are clear to auscultation.  Skin:  Without evidence of ankle edema, or rash. Trunk: The patient's posture is erect.   NEUROLOGIC EXAM: The patient is awake and alert, oriented to place and time.   Memory subjective described as intact.  Attention span & concentration ability appears normal.  Speech is fluent,  without dysarthria, dysphonia or aphasia.  Mood and affect are appropriate.   Cranial nerves: no loss of smell or taste reported  Pupils are equal and briskly reactive to light. Funduscopic exam deferred. .  Extraocular movements in vertical and horizontal planes were intact and without nystagmus. No Diplopia. Visual fields by finger perimetry are intact. Hearing was intact to soft voice and finger rubbing.    Facial sensation intact to fine touch. Facial motor strength is symmetric and tongue and uvula move midline.  Neck ROM : rotation, tilt and flexion extension were normal for age and shoulder shrug was symmetrical.    Motor exam:  Symmetric bulk, tone and ROM.   Normal  tone without cog-wheeling, symmetric grip strength .   Sensory:  Fine touch and vibration were tested  and  normal.  Proprioception tested in the upper extremities was normal.   Coordination: Rapid alternating movements in the fingers/hands were of normal speed.  The Finger-to-nose maneuver was intact without evidence of ataxia, dysmetria or tremor.   Gait and station: Patient could rise unassisted from a seated position, walked without assistive device.  Stance is of normal width/ base and the patient turned with 3 steps.  Toe and heel walk were  deferred.  Deep tendon reflexes: in the  upper and lower extremities are symmetric and intact.  Babinski response was deferred.    ASSESSMENT AND PLAN 27 y.o. year old female  here with:    1) NON restorative sleep for over a decade. Chronic fatigued, sleepy.   2) delayed sleep syndrome, entrained circadian rhythm.  Advance bedtime by one hour to before midnight.   3) heavy and late caffeine use, and nocturia , and sleeping in a room that has the TV on. We discussed sleep environmental rules, shower hot before entering the cool bed room, lights off, nightlight  should shine on th  onto the floor. Read in bed , book with pages, old fashioned  warm light.   4) snoring , sleep choking. Allergic rhinitis and sinusitis - uses decongestant daily- we change this to  zyrtec and no decongestant. This may reduce the morning headaches.  Mother has OSA and is happy with CPAP   I will order a HST for you to evaluate you for sleep apnea and snoring.    I plan to follow up either personally or through our NP within 3-5 months.   I would like to thank Binnie Rail, MD and Binnie Rail, [place],  [place] [address] for allowing me to meet with and to take care of this pleasant patient.   After spending a total time of  45  minutes face to face and additional time for physical and neurologic examination, review of laboratory studies,  personal review of imaging studies, reports and results of other testing and review of referral information / records as far as provided in visit,   Electronically signed by: Larey Seat, MD 04/19/2022 3:41 PM  [facility] certified by The [employer] of Sleep Medicine and Diplomate of the [employer] of Sleep Medicine. Board certified In Neurology through the [employer], Fellow of the [employer] of Neurology. Medical Director of [employer].

## 2022-04-19 NOTE — Patient Instructions (Addendum)
Quality Sleep Information, Adult Quality sleep is important for your mental and physical health. It also improves your quality of life. Quality sleep means you: Are asleep for most of the time you are in bed. Fall asleep within 30 minutes. Wake up no more than once a night. Are awake for no longer than 20 minutes if you do wake up during the night. Most adults need 7-8 hours of quality sleep each night. How can poor sleep affect me? If you do not get enough quality sleep, you may have: Mood swings. Daytime sleepiness. Decreased alertness, reaction time, and concentration. Sleep disorders, such as insomnia and sleep apnea. Difficulty with: Solving problems. Coping with stress. Paying attention. These issues may affect your performance and productivity at work, school, and home. Lack of sleep may also put you at higher risk for accidents, suicide, and risky behaviors. If you do not get quality sleep, you may also be at higher risk for several health problems, including: Infections. Type 2 diabetes. Heart disease. High blood pressure. Obesity. Worsening of long-term conditions, like arthritis, kidney disease, depression, Parkinson's disease, and epilepsy. What actions can I take to get more quality sleep? Sleep schedule and routine Stick to a sleep schedule. Go to sleep and wake up at about the same time each day. Do not try to sleep less on weekdays and make up for lost sleep on weekends. This does not work. Limit naps during the day to 30 minutes or less. Do not take naps in the late afternoon. Make time to relax before bed. Reading, listening to music, or taking a hot bath promotes quality sleep. Make your bedroom a place that promotes quality sleep. Keep your bedroom dark, quiet, and at a comfortable room temperature. Make sure your bed is comfortable. Avoid using electronic devices that give off bright blue light for 30 minutes before bedtime. Your brain perceives bright blue light  as sunlight. This includes television, phones, and computers. If you are lying awake in bed for longer than 20 minutes, get up and do a relaxing activity until you feel sleepy. Lifestyle     Try to get at least 30 minutes of exercise on most days. Do not exercise 2-3 hours before going to bed. Do not use any products that contain nicotine or tobacco. These products include cigarettes, chewing tobacco, and vaping devices, such as e-cigarettes. If you need help quitting, ask your health care provider. Do not drink caffeinated beverages for at least 8 hours before going to bed. Coffee, tea, and some sodas contain caffeine. Do not drink alcohol or eat large meals close to bedtime. Try to get at least 30 minutes of sunlight every day. Morning sunlight is best. Medical concerns Work with your health care provider to treat medical conditions that may affect sleeping, such as: Nasal obstruction. Snoring. Sleep apnea and other sleep disorders. Talk to your health care provider if you think any of your prescription medicines may cause you to have difficulty falling or staying asleep. If you have sleep problems, talk with a sleep consultant. If you think you have a sleep disorder, talk with your health care provider about getting evaluated by a specialist. Where to find more information Sleep Foundation: sleepfoundation.org American Academy of Sleep Medicine: aasm.org Centers for Disease Control and Prevention (CDC): StoreMirror.com.cy Contact a health care provider if: You have trouble getting to sleep or staying asleep. You often wake up very early in the morning and cannot get back to sleep. You have daytime sleepiness. You  have daytime sleep attacks of suddenly falling asleep and sudden muscle weakness (narcolepsy). You have a tingling sensation in your legs with a strong urge to move your legs (restless legs syndrome). You stop breathing briefly during sleep (sleep apnea). You think you have a sleep  disorder or are taking a medicine that is affecting your quality of sleep. Summary Most adults need 7-8 hours of quality sleep each night. Getting enough quality sleep is important for your mental and physical health. Make your bedroom a place that promotes quality sleep, and avoid things that may cause you to have poor sleep, such as alcohol, caffeine, smoking, or large meals. Talk to your health care provider if you have trouble falling asleep or staying asleep. This information is not intended to replace advice given to you by your health care provider. Make sure you discuss any questions you have with your health care provider. Document Revised: 04/27/2021 Document Reviewed: 04/27/2021 Elsevier Patient Education  [place] PLAN 27 y.o. year old female  here with:    1) NON - restorative sleep for over a decade. Chronic fatigued, sleepy.   2) delayed sleep syndrome, entrained circadian rhythm.  Advance bedtime by one hour to before midnight.   3) heavy and late caffeine use, and nocturia and headache can all be related-    4) Sleep Hygiene : sleeping in a room that has the TV on. We discussed sleep environmental rules, shower hot before entering the cool bed room, lights off, nightlight  should shine on th  onto the floor. Read in bed , read in a book with pages, old fashioned warm reading light. Turn the clock away or cover it.   5) snoring , sleep choking. Allergic rhinitis and sinusitis - uses decongestant daily- we change this to  zyrtec and no decongestant.   Mother has OSA and is happy with CPAP   I will order a HST for you to evaluate you for sleep apnea and snoring.    I plan to follow up either personally or through our NP within 3-5 months.

## 2022-04-26 ENCOUNTER — Other Ambulatory Visit: Payer: Self-pay | Admitting: Internal Medicine

## 2022-04-27 ENCOUNTER — Other Ambulatory Visit: Payer: Self-pay

## 2022-05-04 ENCOUNTER — Telehealth: Payer: Self-pay | Admitting: Neurology

## 2022-05-04 NOTE — Telephone Encounter (Signed)
HST- [employer] no auth req.  Patient is scheduled at [facility] for 05/16/22 at 10 AM  Mailed packet to the patient.

## 2022-05-16 ENCOUNTER — Ambulatory Visit: Payer: No Typology Code available for payment source | Admitting: Neurology

## 2022-05-16 DIAGNOSIS — R0683 Snoring: Secondary | ICD-10-CM

## 2022-05-16 DIAGNOSIS — R351 Nocturia: Secondary | ICD-10-CM

## 2022-05-16 DIAGNOSIS — R519 Headache, unspecified: Secondary | ICD-10-CM

## 2022-05-16 DIAGNOSIS — J309 Allergic rhinitis, unspecified: Secondary | ICD-10-CM

## 2022-05-16 DIAGNOSIS — G478 Other sleep disorders: Secondary | ICD-10-CM

## 2022-05-30 ENCOUNTER — Ambulatory Visit: Payer: No Typology Code available for payment source | Admitting: Internal Medicine

## 2022-06-22 ENCOUNTER — Ambulatory Visit: Payer: No Typology Code available for payment source | Admitting: Neurology

## 2022-06-22 DIAGNOSIS — R5383 Other fatigue: Secondary | ICD-10-CM

## 2022-06-22 DIAGNOSIS — G4733 Obstructive sleep apnea (adult) (pediatric): Secondary | ICD-10-CM | POA: Diagnosis not present

## 2022-06-22 DIAGNOSIS — R351 Nocturia: Secondary | ICD-10-CM

## 2022-06-22 DIAGNOSIS — G478 Other sleep disorders: Secondary | ICD-10-CM

## 2022-06-22 DIAGNOSIS — J309 Allergic rhinitis, unspecified: Secondary | ICD-10-CM

## 2022-06-22 DIAGNOSIS — R0683 Snoring: Secondary | ICD-10-CM

## 2022-06-22 DIAGNOSIS — R519 Headache, unspecified: Secondary | ICD-10-CM

## 2022-07-09 ENCOUNTER — Encounter: Payer: Self-pay | Admitting: Neurology

## 2022-07-10 NOTE — Telephone Encounter (Signed)
Pt is requesting Sleep Study results that were done on 06/27/2022.

## 2022-07-21 ENCOUNTER — Other Ambulatory Visit: Payer: Self-pay | Admitting: Internal Medicine

## 2022-07-25 NOTE — Progress Notes (Signed)
[facility]   HOME SLEEP TEST REPORT ( MAIL by Watch PAT)   STUDY DATA:  07-25-2022 Cindy Bates 27 year old female [birth year]   ORDERING CLINICIAN:  REFERRING CLINICIAN: Eileen Stanford, MD   CLINICAL INFORMATION/HISTORY:Law student , high caffeine intake.reports  sleep related headaches, snoring, sinus disease- allergic seasonal. Nocturia.  Patient states her mother witnessed that she snores at night and she doesn't sleep well at night. She wakes up unrefreshed and with dull Headaches.  Rarely did a headache wake her, she has nocturia 2-3 times . 1) NON restorative sleep for over a decade. Chronic fatigued, sleepy.    2) delayed sleep syndrome, entrained circadian rhythm.  Advance bedtime by one hour to before midnight.    3) heavy and late caffeine use, and nocturia , and sleeping in a room that has the TV on. We discussed sleep environmental rules, shower hot before entering the cool bed room, lights off, nightlight  should shine on th  onto the floor. Read in bed , book with pages, old fashioned  warm light.    4) snoring , sleep choking. Allergic rhinitis and sinusitis - uses decongestant daily- we change this to  zyrtec and no decongestant. This may reduce the morning headaches.  Mother has OSA and is happy with CPAP        Epworth sleepiness score: 17/24.  FSS at 50/ 63    BMI: 29.9 kg/m   Neck Circumference: 15.75"    FINDINGS:   Sleep Summary:   Total Recording Time (hours, min):   8 hours 46 minutes  Total Sleep Time (hours, min): 7 hours 30 minutes               Percent REM (%): 21.8%                                      Respiratory Indices by AASM criteria:   Calculated pAHI (per hour): 1.9/h                           REM pAHI: 3.4/h                                               NREM pAHI: 1.5/h                            Supine AHI: 2.3/h versus a nonsupine AHI of 0/h.  Mean snoring volume was estimated at 40 dB this is very mild snoring  and actually just reaches the threshold of detection.                                                  Oxygen Saturation Statistics:    O2 Saturation Range (%): Between a nadir at 91 and a maximum saturation of 99% with a mean saturation of 96%  O2 Saturation (minutes) <89%: 0 minutes         Pulse Rate Statistics:   Pulse Mean (bpm):   76 bpm              Pulse Range:   Between 59 and 116 bpm              IMPRESSION:  This HST does not indicate sleep disordered breathing or sleep apnea to be present at all.  Based on these data there is no intervention such as CPAP, dental device, inspire device necessary.    RECOMMENDATION: Avoid sleeping in supine position, maintaining an open nasal airway, advance  bedtime to an hour before midnight, reduce late caffeine intake, keep the bedroom cool, quiet and dark.  I follow-up with the sleep clinic is not necessary unless the patient desires so.     INTERPRETING PHYSICIAN:   Melvyn Novas, MD

## 2022-07-25 NOTE — Procedures (Signed)
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 Nocturia.  Patient states her mother witnessed that she snores at night and she doesn't sleep well at night. She wakes up unrefreshed and with dull Headaches.  Rarely did a headache wake her, she has nocturia 2-3 times . 1) NON restorative sleep for over a decade. Chronic fatigued, sleepy.    2) delayed sleep syndrome, entrained circadian rhythm.  Advance bedtime by one hour to before midnight.    3) heavy and late caffeine use, and nocturia , and sleeping in a room that has the TV on. We discussed sleep environmental rules, shower hot before entering the cool bed room, lights off, nightlight  should shine on th  onto the floor. Read in bed , book with pages, old fashioned  warm light.    4) snoring , sleep choking. Allergic rhinitis and sinusitis - uses decongestant daily- we change this to  zyrtec and no decongestant. This may reduce the morning headaches.  Mother has OSA and is happy with CPAP        Epworth sleepiness score: 17/24.  FSS at 50/ 63    BMI: 29.9 kg/m   Neck Circumference: 15.75"    FINDINGS:   Sleep Summary:   Total Recording Time (hours, min):   8 hours 46 minutes  Total Sleep Time (hours, min): 7 hours 30 minutes               Percent REM (%): 21.8%                                      Respiratory Indices by AASM criteria:   Calculated pAHI (per hour): 1.9/h                           REM pAHI: 3.4/h                                               NREM pAHI: 1.5/h                            Supine AHI: 2.3/h versus a nonsupine AHI of 0/h.  Mean snoring volume was estimated at 40 dB this is very mild snoring and actually  just reaches the threshold of detection.                                                  Oxygen Saturation Statistics:    O2 Saturation Range (%): Between a nadir at 91 and a maximum saturation of 99% with a mean saturation of 96%  O2 Saturation (minutes) <89%: 0 minutes         Pulse Rate Statistics:   Pulse Mean (bpm):   76 bpm              Pulse Range:   Between 59 and 116 bpm              IMPRESSION:  This HST does not indicate sleep disordered breathing or sleep apnea to be present at all.  Based on these data there is no intervention such as CPAP, dental device, inspire device necessary.    RECOMMENDATION: Avoid sleeping in supine position, maintaining an open nasal airway, advance  bedtime to an hour before midnight, reduce late caffeine intake, keep the bedroom cool, quiet and dark.  I follow-up with the sleep clinic is not necessary unless the patient desires so.     INTERPRETING PHYSICIAN:   Melvyn Novas, MD

## 2022-10-23 ENCOUNTER — Other Ambulatory Visit: Payer: Self-pay | Admitting: Internal Medicine

## 2022-11-16 ENCOUNTER — Encounter: Payer: Self-pay | Admitting: Internal Medicine

## 2022-11-16 ENCOUNTER — Ambulatory Visit: Payer: No Typology Code available for payment source | Admitting: Internal Medicine

## 2022-11-16 VITALS — BP 136/86 | HR 80 | Temp 98.5°F | Ht 63.0 in | Wt 175.0 lb

## 2022-11-16 DIAGNOSIS — R519 Headache, unspecified: Secondary | ICD-10-CM | POA: Insufficient documentation

## 2022-11-16 MED ORDER — PREDNISONE 10 MG PO TABS: ORAL_TABLET | ORAL | 0 refills | Status: DC

## 2022-11-16 NOTE — Assessment & Plan Note (Signed)
New Have left sided headache - posterior headache - sometimes radiating to left frontal region Daily x 2 weeks Worse at night, comes at night ? Cervicogenic Stop tylenol, aleve Prednisone 40 mg x 2 d, taper by 10 mg every 2 days Can use heat, ice , massage If headache does not resolve she will let me know

## 2022-11-16 NOTE — Progress Notes (Signed)
    Subjective:    Patient ID: Cindy Bates, female    DOB: 06-Nov-1995, 27 y.o.   MRN: [id number]      HPI Cindy Bates is here for  Chief Complaint  Patient presents with   Headache     Having headaches daily x 2 weeks.  The headache is in the left posterior head. Sometimes it radiates up to the frontal region.  It is a throbbing headache that is intermittent. Occurs randomly.  It comes at night - may get better during day.  She has been taking tylenol or aleve.  Has been taking tylenol every day - 2-3 times a day.  Tylenol may help a little.      Medications and allergies reviewed with patient and updated if appropriate.  Current Outpatient Medications on File Prior to Visit  Medication Sig Dispense Refill   EPINEPHrine 0.3 mg/0.3 mL IJ SOAJ injection SMARTSIG:0.3 Milliliter(s) IM Once PRN     escitalopram (LEXAPRO) 20 MG tablet TAKE 1 TABLET BY MOUTH EVERY DAY 90 tablet 0   loratadine-pseudoephedrine (CLARITIN-D 24 HOUR) 10-240 MG 24 hr tablet Take 1 tablet by mouth daily. 30 tablet 3   medroxyPROGESTERone Acetate 150 MG/ML SUSY SMARTSIG:1 Milliliter(s) IM Every 12 Weeks     triamcinolone (KENALOG) 0.025 % ointment Apply 1 application  topically 2 (two) times daily. 30 g 0   valACYclovir (VALTREX) 1000 MG tablet Take 1 tablet (1,000 mg total) by mouth 2 (two) times daily. 180 tablet 1   No current facility-administered medications on file prior to visit.    Review of Systems  Constitutional:  Negative for fever.  Eyes:  Negative for visual disturbance.  Gastrointestinal:  Negative for nausea.  Musculoskeletal:  Negative for back pain (no upper back pain), neck pain and neck stiffness.  Neurological:  Positive for headaches. Negative for dizziness, weakness, light-headedness and numbness.       Objective:   Vitals:   11/16/22 0924  BP: 136/86  Pulse: 80  Temp: 98.5 F (36.9 C)  SpO2: 98%   BP Readings from Last 3 Encounters:  11/16/22 136/86  04/19/22 137/69  03/08/22  120/74   Wt Readings from Last 3 Encounters:  11/16/22 175 lb (79.4 kg)  04/19/22 169 lb (76.7 kg)  03/08/22 169 lb (76.7 kg)   Body mass index is 31 kg/m.    Physical Exam Constitutional:      General: She is not in acute distress.    Appearance: She is well-developed. She is not ill-appearing.  HENT:     Head: Normocephalic and atraumatic.  Musculoskeletal:        General: No tenderness (no tenderness in neck or upper back).  Skin:    General: Skin is warm and dry.  Neurological:     Mental Status: She is alert and oriented to person, place, and time.     Cranial Nerves: No cranial nerve deficit.     Sensory: No sensory deficit.     Motor: No weakness.     Gait: Gait normal.  Psychiatric:        Mood and Affect: Mood normal.            Assessment & Plan:    See Problem List for Assessment and Plan of chronic medical problems.

## 2022-11-16 NOTE — Patient Instructions (Addendum)
      Medications changes include :   stop the tylenol and aleve.   Prednisone taper      Return if symptoms worsen or fail to improve.

## 2022-12-12 ENCOUNTER — Encounter: Payer: Self-pay | Admitting: Internal Medicine

## 2022-12-12 ENCOUNTER — Ambulatory Visit: Payer: No Typology Code available for payment source | Admitting: Internal Medicine

## 2022-12-12 VITALS — BP 118/82 | HR 80 | Temp 98.4°F | Resp 16 | Ht 63.0 in | Wt 178.0 lb

## 2022-12-12 DIAGNOSIS — J029 Acute pharyngitis, unspecified: Secondary | ICD-10-CM | POA: Diagnosis not present

## 2022-12-12 DIAGNOSIS — J069 Acute upper respiratory infection, unspecified: Secondary | ICD-10-CM

## 2022-12-12 LAB — POCT RAPID STREP A (OFFICE): Rapid Strep A Screen: NEGATIVE

## 2022-12-12 MED ORDER — HYDROCOD POLI-CHLORPHE POLI ER 10-8 MG/5ML PO SUER: 5.0000 mL | Freq: Two times a day (BID) | ORAL | 0 refills | Status: AC | PRN

## 2022-12-12 NOTE — Patient Instructions (Addendum)
        Medications changes include :   sudafed, flonase for congestion, cough syrup sent to pharmacy, take ibuprofen      Return if symptoms worsen or fail to improve.

## 2022-12-12 NOTE — Progress Notes (Signed)
Subjective:    Patient ID: Cindy Bates, female    DOB: 1995-09-21, 27 y.o.   MRN: [id number]      HPI Cherril is here for  Chief Complaint  Patient presents with   Sore Throat    Sore throat that started on Sunday; Worse over time and hurts when swallowing    She is here for an acute visit for cold symptoms.   Her symptoms started 2 days ago and has gotten worse.  Her nephew was sick - dx with croup last week.    She is experiencing decreased appetite, fatigue, nasal congestion, mild sinus pressure, sore throat, cough that is occasional, headaches.  No fever, SOB.   She has tried taking gargling with warm salt water, cold and flu medicine, cough drops, tylenol      Medications and allergies reviewed with patient and updated if appropriate.  Current Outpatient Medications on File Prior to Visit  Medication Sig Dispense Refill   EPINEPHrine 0.3 mg/0.3 mL IJ SOAJ injection SMARTSIG:0.3 Milliliter(s) IM Once PRN     escitalopram (LEXAPRO) 20 MG tablet TAKE 1 TABLET BY MOUTH EVERY DAY 90 tablet 0   etonogestrel (NEXPLANON) 68 MG IMPL implant      loratadine-pseudoephedrine (CLARITIN-D 24 HOUR) 10-240 MG 24 hr tablet Take 1 tablet by mouth daily. 30 tablet 3   triamcinolone (KENALOG) 0.025 % ointment Apply 1 application  topically 2 (two) times daily. 30 g 0   valACYclovir (VALTREX) 1000 MG tablet Take 1 tablet (1,000 mg total) by mouth 2 (two) times daily. 180 tablet 1   No current facility-administered medications on file prior to visit.    Review of Systems  Constitutional:  Positive for appetite change (decreased) and fatigue. Negative for fever.  HENT:  Positive for congestion, sinus pressure (mild with nasal drainge) and sore throat. Negative for ear pain and postnasal drip.   Respiratory:  Positive for cough (occ). Negative for chest tightness, shortness of breath and wheezing.   Gastrointestinal:  Negative for abdominal pain, diarrhea and nausea.  Musculoskeletal:   Negative for myalgias.  Neurological:  Positive for headaches. Negative for dizziness and light-headedness.       Objective:   Vitals:   12/12/22 1548  BP: 118/82  Pulse: 80  Resp: 16  Temp: 98.4 F (36.9 C)   BP Readings from Last 3 Encounters:  12/12/22 118/82  11/16/22 136/86  04/19/22 137/69   Wt Readings from Last 3 Encounters:  12/12/22 178 lb (80.7 kg)  11/16/22 175 lb (79.4 kg)  04/19/22 169 lb (76.7 kg)   Body mass index is 31.53 kg/m.    Physical Exam Constitutional:      General: She is not in acute distress.    Appearance: Normal appearance. She is not ill-appearing.  HENT:     Head: Normocephalic and atraumatic.     Right Ear: Tympanic membrane, ear canal and external ear normal.     Left Ear: Tympanic membrane, ear canal and external ear normal.     Mouth/Throat:     Mouth: Mucous membranes are moist.     Pharynx: No oropharyngeal exudate or posterior oropharyngeal erythema.  Eyes:     Conjunctiva/sclera: Conjunctivae normal.  Cardiovascular:     Rate and Rhythm: Normal rate and regular rhythm.  Pulmonary:     Effort: Pulmonary effort is normal. No respiratory distress.     Breath sounds: Normal breath sounds. No wheezing or rales.  Musculoskeletal:     Cervical  back: Neck supple. No tenderness.  Lymphadenopathy:     Cervical: No cervical adenopathy.  Skin:    General: Skin is warm and dry.  Neurological:     Mental Status: She is alert.        Rapid strep test negative    Assessment & Plan:    See Problem List for Assessment and Plan of chronic medical problems.

## 2022-12-12 NOTE — Assessment & Plan Note (Signed)
Acute Symptoms likely viral in nature Rapid strep is negative Continue symptomatic treatment with over-the-counter cold medications, Tylenol/ibuprofen Tussionex cough syrup Increase rest and fluids Call if symptoms worsen or do not improve

## 2023-01-25 ENCOUNTER — Ambulatory Visit: Payer: No Typology Code available for payment source

## 2023-01-26 ENCOUNTER — Other Ambulatory Visit: Payer: Self-pay | Admitting: Internal Medicine

## 2023-01-26 ENCOUNTER — Ambulatory Visit: Payer: No Typology Code available for payment source

## 2023-01-29 ENCOUNTER — Ambulatory Visit: Payer: No Typology Code available for payment source

## 2023-04-28 ENCOUNTER — Other Ambulatory Visit: Payer: Self-pay | Admitting: Internal Medicine

## 2023-08-07 ENCOUNTER — Other Ambulatory Visit: Payer: Self-pay | Admitting: Internal Medicine
